# Patient Record
Sex: Female | Born: 1985 | Race: White | Hispanic: No | Marital: Single | Smoking: Current every day smoker
Health system: Southern US, Community
[De-identification: ages and names within clinical notes are randomized; demographics above are authoritative.]

## PROBLEM LIST (undated history)

## (undated) DIAGNOSIS — F17200 Nicotine dependence, unspecified, uncomplicated: Secondary | ICD-10-CM

## (undated) DIAGNOSIS — K589 Irritable bowel syndrome without diarrhea: Secondary | ICD-10-CM

## (undated) HISTORY — DX: Irritable bowel syndrome, unspecified: K58.9

## (undated) HISTORY — DX: Nicotine dependence, unspecified, uncomplicated: F17.200

---
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
## 2003-12-26 HISTORY — PX: CHOLECYSTECTOMY: SHX55

## 2004-12-25 HISTORY — PX: COLONOSCOPY: SHX174

## 2009-09-13 ENCOUNTER — Ambulatory Visit: Payer: Self-pay | Admitting: Internal Medicine

## 2010-12-25 HISTORY — PX: SHOULDER SURGERY: SHX246

## 2011-11-08 ENCOUNTER — Ambulatory Visit: Payer: Self-pay

## 2011-12-23 ENCOUNTER — Inpatient Hospital Stay: Payer: Self-pay | Admitting: Obstetrics and Gynecology

## 2011-12-30 ENCOUNTER — Emergency Department: Payer: Self-pay | Admitting: Emergency Medicine

## 2013-02-06 ENCOUNTER — Ambulatory Visit: Payer: 59 | Admitting: Family Medicine

## 2013-02-13 ENCOUNTER — Encounter: Payer: Self-pay | Admitting: Family Medicine

## 2013-02-13 ENCOUNTER — Ambulatory Visit (INDEPENDENT_AMBULATORY_CARE_PROVIDER_SITE_OTHER): Payer: 59 | Admitting: Family Medicine

## 2013-02-13 VITALS — BP 140/90 | HR 80 | Temp 98.7°F | Ht 63.0 in | Wt 124.2 lb

## 2013-02-13 DIAGNOSIS — F17200 Nicotine dependence, unspecified, uncomplicated: Secondary | ICD-10-CM

## 2013-02-13 DIAGNOSIS — K589 Irritable bowel syndrome without diarrhea: Secondary | ICD-10-CM

## 2013-02-13 DIAGNOSIS — Z0000 Encounter for general adult medical examination without abnormal findings: Secondary | ICD-10-CM

## 2013-02-13 NOTE — Assessment & Plan Note (Signed)
Precontemplative. Knows to return if decides to cut back.

## 2013-02-13 NOTE — Patient Instructions (Addendum)
Good to meet you today, call us with questions. Return as needed or in 2-3 years for next physical Keep thinking about quitting smoking - let me know if you want assistance. remember sunscreen! Blood work today.

## 2013-02-13 NOTE — Progress Notes (Signed)
Subjective:    Patient ID: Jacqueline [name], female    DOB: 24-Oct-1986, 27 y.o.   MRN: [id number]  HPI CC: new pt to establish  Smoker - 1/2 ppd for last 9 years.  Precontemplative.  Habit and enjoys social aspect.  Has tried e cig and lozenges.  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  H/o IBS.  Quiescent.  Knows dietary regimen.  Seat belt use discussed. Sunscreen use discussed.  Preventative: well woman with OBGYN, gets yearly and normal Tdap - 2012 Colonoscopy 2006 - normal.  Done in [place].  Along with upper and lower GI.  Strong fmhx colon cancer.  Told had IBS.  Medications and allergies reviewed and updated in chart.  Past histories reviewed and updated if relevant as below. There is no problem list on file for this patient.  Past Medical History  Diagnosis Date  . Smoker    Past Surgical History  Procedure Laterality Date  . Rotator cuff repair  2004    right  . Shoulder surgery  2012    bone spurs; right  . Cesarean section  2012  . Cholecystectomy  2005   History  Substance Use Topics  . Smoking status: Current Every Day Smoker -- 0.50 packs/day    Types: Cigarettes  . Smokeless tobacco: Never Used  . Alcohol Use: Yes     Comment: very rare (6 pack/year)   Family History  Problem Relation Age of Onset  . Cancer Paternal Uncle 40    colon  . Cancer Paternal Grandfather 29    colon  . CAD Maternal Grandfather 40    MI  . CAD Paternal Grandfather   . Hypertension Mother   . Hyperlipidemia Mother   . Thyroid disease Mother   . Stroke Neg Hx   . Diabetes Neg Hx    Allergies  Allergen Reactions  . Demerol (Meperidine) Nausea And Vomiting   No current outpatient prescriptions on file prior to visit.   No current facility-administered medications on file prior to visit.     Review of Systems  Constitutional: Negative for fever, chills, activity change, appetite change, fatigue and unexpected weight change.  HENT: Negative for hearing loss and neck  pain.   Eyes: Negative for visual disturbance.  Respiratory: Negative for cough, chest tightness, shortness of breath and wheezing.   Cardiovascular: Negative for chest pain, palpitations and leg swelling.  Gastrointestinal: Negative for nausea, vomiting, abdominal pain, diarrhea, constipation, blood in stool and abdominal distention.  Genitourinary: Negative for hematuria and difficulty urinating.  Musculoskeletal: Negative for myalgias and arthralgias.  Skin: Negative for rash.  Neurological: Negative for dizziness, seizures, syncope and headaches.  Hematological: Does not bruise/bleed easily.  Psychiatric/Behavioral: Negative for dysphoric mood. The patient is not nervous/anxious.        Objective:   Physical Exam  Nursing note and vitals reviewed. Constitutional: She is oriented to person, place, and time. She appears well-developed and well-nourished. No distress.  HENT:  Head: Normocephalic and atraumatic.  Right Ear: Hearing, tympanic membrane, external ear and ear canal normal.  Left Ear: Hearing, tympanic membrane, external ear and ear canal normal.  Nose: Nose normal.  Mouth/Throat: Oropharynx is clear and moist. No oropharyngeal exudate.  Eyes: Conjunctivae and EOM are normal. Pupils are equal, round, and reactive to light. No scleral icterus.  Neck: Normal range of motion. Neck supple. No thyromegaly present.  Cardiovascular: Normal rate, regular rhythm, normal heart sounds and intact distal pulses.   No murmur heard. Pulses:  Radial pulses are 2+ on the right side, and 2+ on the left side.  Pulmonary/Chest: Effort normal and breath sounds normal. No respiratory distress. She has no wheezes. She has no rales.  Abdominal: Soft. Bowel sounds are normal. She exhibits no distension and no mass. There is no tenderness. There is no rebound and no guarding.  Musculoskeletal: Normal range of motion. She exhibits no edema.  Lymphadenopathy:    She has no cervical adenopathy.   Neurological: She is alert and oriented to person, place, and time.  CN grossly intact, station and gait intact  Skin: Skin is warm and dry. No rash noted.  Psychiatric: She has a normal mood and affect. Her behavior is normal. Judgment and thought content normal.      Assessment & Plan:

## 2013-02-13 NOTE — Assessment & Plan Note (Signed)
Preventative protocols reviewed and updated unless pt declined. Discussed healthy diet and lifestyle.  Sunscreen use discussed.

## 2013-02-14 LAB — BASIC METABOLIC PANEL
BUN: 12 mg/dL (ref 6–23)
CO2: 24 mEq/L (ref 19–32)
Calcium: 9.1 mg/dL (ref 8.4–10.5)
Chloride: 108 mEq/L (ref 96–112)
Creatinine, Ser: 0.9 mg/dL (ref 0.4–1.2)
Glucose, Bld: 83 mg/dL (ref 70–99)

## 2013-02-14 LAB — LIPID PANEL
Cholesterol: 174 mg/dL (ref 0–200)
HDL: 60.2 mg/dL (ref >39.00)
Triglycerides: 111 mg/dL (ref 0.0–149.0)

## 2013-02-17 ENCOUNTER — Encounter: Payer: Self-pay | Admitting: *Deleted

## 2014-01-29 ENCOUNTER — Encounter: Payer: Self-pay | Admitting: Family Medicine

## 2014-01-29 ENCOUNTER — Telehealth: Payer: Self-pay | Admitting: Radiology

## 2014-01-29 ENCOUNTER — Ambulatory Visit (INDEPENDENT_AMBULATORY_CARE_PROVIDER_SITE_OTHER): Payer: 59 | Admitting: Family Medicine

## 2014-01-29 VITALS — BP 142/90 | HR 88 | Temp 98.1°F | Wt 131.5 lb

## 2014-01-29 DIAGNOSIS — R454 Irritability and anger: Secondary | ICD-10-CM

## 2014-01-29 MED ORDER — LORAZEPAM 0.5 MG PO TABS: 0.5000 mg | ORAL_TABLET | Freq: Two times a day (BID) | ORAL | Status: DC | PRN

## 2014-01-29 MED ORDER — CITALOPRAM HYDROBROMIDE 10 MG PO TABS: 10.0000 mg | ORAL_TABLET | Freq: Every day | ORAL | Status: DC

## 2014-01-29 NOTE — Progress Notes (Signed)
   Subjective:    Patient ID: Jacqueline PineBeth Siever, female    DOB: 08/08/1986, 28 y.o.   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  Stressed with new responsibilities at work.  Anger outbursts at home and at work - more irritable.  Easily lashes out with minor things.  [place] working out of [place], now moving to [place].  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  Endorses some depression.  Moody.  No anhedonia - enjoys reading and riding side by sides.  Trouble sleeping.  Energy level decreased.  Ability to concentrate decreased.  Appetite down - lost 8 lbs in the last week.  No healthy stress relieving strategies - smokes.  Does not exercise.  Enjoys reading but doesn't dedicate time. Some anxiety as well - but without anxiety/panic attacks.  Stays wound up and worrying about work.  No SI/HI.  Chest pain - describes pressure sensation prior to anger outburst.  Also with headaches - daily.  Denies abd pain, dyspnea.  Smoking - increased with increase in stress.  Up to 3/4 ppd.   HTN - borderline in the past.  Checks BP at inlaw's house - tends to run high at times.  On Beyaz for last 4 yrs.  Has 30 yo at home. BP Readings from Last 3 Encounters:  01/29/14 142/90  02/13/13 140/90     Past Medical History  Diagnosis Date  . Smoker   . IBS (irritable bowel syndrome)     h/o this    Past Surgical History  Procedure Laterality Date  . Rotator cuff repair  2004    right  . Shoulder surgery  2012    bone spurs; right  . Cesarean section  2012  . Cholecystectomy  2005  . Colonoscopy  2006    WNL per pt    Family History  Problem Relation Age of Onset  . Cancer Paternal Uncle 8055    colon  . Cancer Paternal Grandfather 2975    colon  . CAD Maternal Grandfather 40    MI  . CAD Paternal Grandfather   . Hypertension Mother   . Hyperlipidemia Mother   . Thyroid disease Mother   . Stroke Neg  Hx   . Diabetes Neg Hx     Review of Systems Per HPI    Objective:   Physical Exam  Nursing note and vitals reviewed. Constitutional: She appears well-developed and well-nourished. No distress.  HENT:  Mouth/Throat: Oropharynx is clear and moist. No oropharyngeal exudate.  Cardiovascular: Normal rate, regular rhythm, normal heart sounds and intact distal pulses.   No murmur heard. Pulmonary/Chest: Effort normal and breath sounds normal. No respiratory distress. She has no wheezes. She has no rales.  Psychiatric: She has a normal mood and affect. Her behavior is normal. Judgment and thought content normal.       Assessment & Plan:

## 2014-01-29 NOTE — Telephone Encounter (Signed)
LMOM for pt to come back for lab work,she left w/o having her blood work done.

## 2014-01-29 NOTE — Progress Notes (Signed)
Pre-visit discussion using our clinic review tool. No additional management support is needed unless otherwise documented below in the visit note.  

## 2014-01-29 NOTE — Patient Instructions (Addendum)
questionairre today. Start celexa 10mg  daily and lorazepam 0.5mg  twice daily as needed for anxiety or stress (if feeling overwhelmed).  Try to use second med sparingly. Work on healthy stress relieving strategies. Return in 2-3 for follow up, sooner if needed. Think about counseling - if interested let me know.

## 2014-01-29 NOTE — Assessment & Plan Note (Addendum)
Discussed stress leading to anger as manifestation of anxiety/depression. Anticipate adjustment disorder 2/2 new work Counselling psychologistresponsibilities. Possibility of this improving in next few months with things normalizing at work. Pt interested in medication to help with this - start celexa 10mg  daily (discussed slow taper if decides to discontinue, discussed takes 3-4 weeks to take effect, discussed possible worsening of sxs - if this happens pt to call us immediately) Use lorazepam 1/2 to 1 tablet of 0.5mg  bid prn anxiety/stress.  Discussed temporary nature of this medication. Pt will consider counseling and let me know if desires referral. A total of 25 minutes were spent face-to-face with the patient during this encounter and over half of that time was spent on counseling and coordination of care PHQ9 = 15/27 GAD7 = 17/21

## 2014-01-30 ENCOUNTER — Telehealth: Payer: Self-pay | Admitting: Family Medicine

## 2014-01-30 NOTE — Telephone Encounter (Signed)
Relevant patient education mailed to patient.  

## 2014-02-02 NOTE — Telephone Encounter (Signed)
Spoke to pts husband, he will give her the message

## 2014-02-03 ENCOUNTER — Other Ambulatory Visit (INDEPENDENT_AMBULATORY_CARE_PROVIDER_SITE_OTHER): Payer: 59

## 2014-02-03 DIAGNOSIS — R454 Irritability and anger: Secondary | ICD-10-CM

## 2014-02-03 NOTE — Addendum Note (Signed)
Addended by: Alvina ChouWALSH, TERRI J on: 02/03/2014 10:46 AM   Modules accepted: Orders

## 2014-02-04 LAB — TSH: TSH: 0.52 u[IU]/mL (ref 0.35–5.50)

## 2014-02-05 ENCOUNTER — Encounter: Payer: Self-pay | Admitting: *Deleted

## 2014-06-30 ENCOUNTER — Encounter: Payer: Self-pay | Admitting: Family Medicine

## 2014-06-30 ENCOUNTER — Ambulatory Visit (INDEPENDENT_AMBULATORY_CARE_PROVIDER_SITE_OTHER): Payer: 59 | Admitting: Family Medicine

## 2014-06-30 ENCOUNTER — Ambulatory Visit: Payer: 59 | Admitting: Family Medicine

## 2014-06-30 ENCOUNTER — Telehealth: Payer: Self-pay | Admitting: Family Medicine

## 2014-06-30 VITALS — BP 132/86 | HR 74 | Temp 98.6°F | Wt 127.0 lb

## 2014-06-30 DIAGNOSIS — R454 Irritability and anger: Secondary | ICD-10-CM

## 2014-06-30 MED ORDER — SERTRALINE HCL 25 MG PO TABS: 25.0000 mg | ORAL_TABLET | Freq: Every day | ORAL | Status: DC

## 2014-06-30 NOTE — Progress Notes (Signed)
   BP 132/86  Pulse 74  Temp(Src) 98.6 F (37 C) (Oral)  Wt 127 lb (57.607 kg)  SpO2 98%  LMP 06/18/2014   CC: f/u anxiety  Subjective:    Patient ID: Jacqueline [name], female    DOB: 1986/02/20, 28 y.o.   MRN: [id number]  HPI: Jacqueline [name] is a 28 y.o. female presenting on 06/30/2014 for Follow-up   See prior note from 01/2014 for details. At that time, thought adjustment disorder after recent job promotion with increased stress. Started on celexa and lorazepam. Celexa caused increased aggressiveness per husband so she stopped this. Never tried lorazepam. May lose work at end of year. Staying very stressed at work. Staying stressed at home.   Staying angry, but feels able to manage stress better than prior.  To change birth control as well - possible hormonal component to irritability.  Changing from [place] to Nexplanon.  Relevant past medical, surgical, family and social history reviewed and updated as indicated.  Allergies and medications reviewed and updated. No current outpatient prescriptions on file prior to visit.   No current facility-administered medications on file prior to visit.    Review of Systems Per HPI unless specifically indicated above    Objective:    BP 132/86  Pulse 74  Temp(Src) 98.6 F (37 C) (Oral)  Wt 127 lb (57.607 kg)  SpO2 98%  LMP 06/18/2014  Physical Exam  Nursing note and vitals reviewed. Constitutional: She appears well-developed and well-nourished. No distress.  Psychiatric: She has a normal mood and affect.       Assessment & Plan:   Problem List Items Addressed This Visit   Irritability and anger - Primary     Presumed adjustment disorder - did not tolerate celexa (worsening irritability). Will trial zoloft 25mg  daily, asked her to call me in 1 mo with update. rtc PRN if needed. Again discussed possible worsening prior to improvement - if this happens, stop med immediately and notify me PHQ9 = 15 --> 2 GAD7 = 17 --> 12.        Follow up plan: Return as needed, for follow up visit.

## 2014-06-30 NOTE — Patient Instructions (Addendum)
Let's try zoloft (sertraline) 25mg  daily. If helpful we may increase dose. Call me with an update in 1 month with how you're doing. Good to see you today, call us with questions.

## 2014-06-30 NOTE — Telephone Encounter (Signed)
Relevant patient education assigned to patient using Emmi. ° °

## 2014-06-30 NOTE — Assessment & Plan Note (Signed)
Presumed adjustment disorder - did not tolerate celexa (worsening irritability). Will trial zoloft 25mg  daily, asked her to call me in 1 mo with update. rtc PRN if needed. Again discussed possible worsening prior to improvement - if this happens, stop med immediately and notify me PHQ9 = 15 --> 2 GAD7 = 17 --> 12.

## 2014-06-30 NOTE — Progress Notes (Signed)
Pre visit review using our clinic review tool, if applicable. No additional management support is needed unless otherwise documented below in the visit note. 

## 2014-09-04 ENCOUNTER — Other Ambulatory Visit: Payer: Self-pay | Admitting: *Deleted

## 2014-09-04 MED ORDER — SERTRALINE HCL 25 MG PO TABS: 25.0000 mg | ORAL_TABLET | Freq: Every day | ORAL | Status: DC

## 2015-01-04 ENCOUNTER — Other Ambulatory Visit: Payer: Self-pay | Admitting: Family Medicine

## 2015-01-04 NOTE — Telephone Encounter (Signed)
p left v/m requesting refill status of sertraline. Left v/m for pt sertraline was refilled earlier today to [facility].

## 2015-02-05 ENCOUNTER — Ambulatory Visit: Payer: Self-pay | Admitting: Family Medicine

## 2015-04-18 NOTE — Discharge Summary (Signed)
PATIENT NAMLum Keas:  [name], Jacqueline [name] MR#:  [id number] DATE OF BIRTH:  [birth year]  DATE OF ADMISSION:  12/23/2011 DATE OF DISCHARGE:  12/26/2011  ADMISSION DIAGNOSES:  1. Term.  2. Labor.  3. Cephalopelvic tubes disproportion with fetal distress.   DISCHARGE DIAGNOSES:  1. Term.  2. Labor.  3. Cephalopelvic disproportion with fetal distress.   PROCEDURE: Cesarean section.  HOSPITAL COURSE: The patient did well, tolerating diet on day one. Postoperative hematocrit  was 32.0. On day three she was deemed ready for discharge and was discharged home with routine prescriptions, precautions, and follow-up.  ____________________________ Reatha Harpsicky L. Logan BoresEvans, MD rle:slb D: 01/01/2012 22:02:35 ET T: 01/02/2012 17:01:29 ET JOB#: [id number]  cc: Ricky L. Logan BoresEvans, MD, <Dictator> Augustina MoodICK L Lekisha Mcghee MD ELECTRONICALLY SIGNED 01/03/2012 9:24

## 2015-08-02 ENCOUNTER — Other Ambulatory Visit: Payer: Self-pay | Admitting: Family Medicine

## 2015-10-27 ENCOUNTER — Encounter: Payer: Self-pay | Admitting: Family Medicine

## 2015-10-27 ENCOUNTER — Ambulatory Visit (INDEPENDENT_AMBULATORY_CARE_PROVIDER_SITE_OTHER): Payer: 59 | Admitting: Family Medicine

## 2015-10-27 VITALS — BP 142/95 | HR 96 | Temp 98.8°F | Ht 63.5 in | Wt 131.5 lb

## 2015-10-27 DIAGNOSIS — J189 Pneumonia, unspecified organism: Secondary | ICD-10-CM

## 2015-10-27 DIAGNOSIS — R509 Fever, unspecified: Secondary | ICD-10-CM

## 2015-10-27 DIAGNOSIS — Z720 Tobacco use: Secondary | ICD-10-CM | POA: Diagnosis not present

## 2015-10-27 DIAGNOSIS — F17200 Nicotine dependence, unspecified, uncomplicated: Secondary | ICD-10-CM

## 2015-10-27 LAB — POCT INFLUENZA A/B
INFLUENZA A, POC: NEGATIVE
INFLUENZA B, POC: NEGATIVE

## 2015-10-27 MED ORDER — LEVOFLOXACIN 500 MG PO TABS: 500.0000 mg | ORAL_TABLET | Freq: Every day | ORAL | Status: DC

## 2015-10-27 MED ORDER — HYDROCODONE-HOMATROPINE 5-1.5 MG/5ML PO SYRP: ORAL_SOLUTION | ORAL | Status: DC

## 2015-10-27 MED ORDER — PREDNISONE 20 MG PO TABS: ORAL_TABLET | ORAL | Status: DC

## 2015-10-27 NOTE — Progress Notes (Signed)
Dr. Karleen Hampshire T. Marialena Wollen, MD, CAQ Sports Medicine [facility] [address] [place], [address] Phone: [phone] Fax: [phone]  10/27/2015  Patient: Jacqueline [name], MRN: [id number], DOB: October 05, 1986, 29 y.o.  Primary Physician:  Eustaquio Boyden, MD   Chief Complaint  Patient presents with  . Cough    x 6 weeks  . Sore Throat  . Nasal Congestion   Subjective:   Jacqueline [name] is a 29 y.o. very pleasant female patient who presents with the following:  Coughing for 6 weeks, not better after z-pak and steroids.  Down to 5 cigs, 10 pack years.  + fever. Whole body is hurting, worse in the last 48 hours.  Some chest pain.   Not sleeping at all. Coughing all the time. Myalgia, arthralgia in the last few days  Past Medical History, Surgical History, Social History, Family History, Problem List, Medications, and Allergies have been reviewed and updated if relevant.  Patient Active Problem List   Diagnosis Date Noted  . Irritability and anger 01/29/2014  . Healthcare maintenance 02/13/2013  . Smoker   . IBS (irritable bowel syndrome)     Past Medical History  Diagnosis Date  . Smoker   . IBS (irritable bowel syndrome)     h/o this    Past Surgical History  Procedure Laterality Date  . Rotator cuff repair  2004    right  . Shoulder surgery  2012    bone spurs; right  . Cesarean section  2012  . Cholecystectomy  2005  . Colonoscopy  2006    WNL per pt    Social History   Social History  . Marital Status: Single    Spouse Name: N/A  . Number of Children: N/A  . Years of Education: N/A   Occupational History  . Not on file.   Social History Main Topics  . Smoking status: Current Every Day Smoker -- 0.50 packs/day    Types: Cigarettes  . Smokeless tobacco: Never Used  . Alcohol Use: Yes     Comment: very rare (6 pack/year)  . Drug Use: No  . Sexual Activity: Not on file   Other Topics Concern  . Not on file   Social History  Narrative   Caffeine: 1-2 soft drinks/day   Lives with husband and 1 child, 2 dogs.   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 Cancer Paternal Uncle 26    colon  . Cancer Paternal Grandfather 4    colon  . CAD Maternal Grandfather 40    MI  . CAD Paternal Grandfather   . Hypertension Mother   . Hyperlipidemia Mother   . Thyroid disease Mother   . Stroke Neg Hx   . Diabetes Neg Hx     Allergies  Allergen Reactions  . Demerol [Meperidine] Nausea And Vomiting    Medication list reviewed and updated in full in [facility].  ROS: GEN: Acute illness details above GI: Tolerating PO intake GU: maintaining adequate hydration and urination Pulm: occ SOB Interactive and getting along well at home.  Otherwise, ROS is as per the HPI.   Objective:   BP 142/95 mmHg  Pulse 96  Temp(Src) 98.8 F (37.1 C) (Oral)  Ht 5' 3.5" (1.613 m)  Wt 131 lb  8 oz (59.648 kg)  BMI 22.93 kg/m2  SpO2 100%  LMP 10/06/2015 (Approximate)   GEN: A and O x 3. WDWN. NAD.    ENT: Nose clear, ext NML.  No LAD.  No JVD.  TM's clear. Oropharynx clear.  PULM: Normal WOB, no distress. No crackles, rare wheezes, no rhonchi. CV: RRR, no M/G/R, No rubs, No JVD.   EXT: warm and well-perfused, No c/c/e. PSYCH: Pleasant and conversant.    Laboratory and Imaging Data: Results for orders placed or performed in visit on 10/27/15  POCT Influenza A/B  Result Value Ref Range   Influenza A, POC Negative Negative   Influenza B, POC Negative Negative     Assessment and Plan:   Walking pneumonia  Fever, unspecified - Plan: POCT Influenza A/B  Smoker  Flu vs flu-like illness acutely with 6 weeks of prod cough, will cover with ABX Smoker, sob, rare wheezing, add prednisone  Follow-up: No Follow-up on file.  New Prescriptions    HYDROCODONE-HOMATROPINE (HYCODAN) 5-1.5 MG/5ML SYRUP    1 tsp po at night before bed prn cough   LEVOFLOXACIN (LEVAQUIN) 500 MG TABLET    Take 1 tablet (500 mg total) by mouth daily.   PREDNISONE (DELTASONE) 20 MG TABLET    2 tabs po for 5 days   Modified Medications   No medications on file   Orders Placed This Encounter  Procedures  . POCT Influenza A/B    Signed,  Brynlynn Walko T. Tamaria Dunleavy, MD   Patient's Medications  New Prescriptions   HYDROCODONE-HOMATROPINE (HYCODAN) 5-1.5 MG/5ML SYRUP    1 tsp po at night before bed prn cough   LEVOFLOXACIN (LEVAQUIN) 500 MG TABLET    Take 1 tablet (500 mg total) by mouth daily.   PREDNISONE (DELTASONE) 20 MG TABLET    2 tabs po for 5 days  Previous Medications   NUVARING 0.12-0.015 MG/24HR VAGINAL RING    PLACE 1 RING VAGINALLY MONTHLY. LEAVE IN PLACE FOR 3 CONSECUTIVE WEEKS, THEN REMOVE FOR 1 WEEK.  Modified Medications   No medications on file  Discontinued Medications   SERTRALINE (ZOLOFT) 25 MG TABLET    Take one tablet daily **MUST HAVE FOLLOW UP FOR FURTHER REFILLS**

## 2015-10-27 NOTE — Progress Notes (Signed)
Pre visit review using our clinic review tool, if applicable. No additional management support is needed unless otherwise documented below in the visit note. 

## 2016-09-19 ENCOUNTER — Other Ambulatory Visit: Payer: 59

## 2016-09-20 ENCOUNTER — Other Ambulatory Visit: Payer: 59

## 2016-09-21 ENCOUNTER — Encounter: Payer: Self-pay | Admitting: Family Medicine

## 2016-09-21 ENCOUNTER — Ambulatory Visit (INDEPENDENT_AMBULATORY_CARE_PROVIDER_SITE_OTHER): Payer: 59 | Admitting: Family Medicine

## 2016-09-21 VITALS — BP 132/78 | HR 87 | Temp 98.5°F | Ht 63.0 in | Wt 128.5 lb

## 2016-09-21 DIAGNOSIS — F17200 Nicotine dependence, unspecified, uncomplicated: Secondary | ICD-10-CM

## 2016-09-21 DIAGNOSIS — Z720 Tobacco use: Secondary | ICD-10-CM | POA: Diagnosis not present

## 2016-09-21 DIAGNOSIS — Z0000 Encounter for general adult medical examination without abnormal findings: Secondary | ICD-10-CM | POA: Diagnosis not present

## 2016-09-21 DIAGNOSIS — Z8349 Family history of other endocrine, nutritional and metabolic diseases: Secondary | ICD-10-CM | POA: Diagnosis not present

## 2016-09-21 NOTE — Progress Notes (Signed)
BP 132/78 (BP Location: Left Arm, Patient Position: Sitting, Cuff Size: Normal)   Pulse 87   Temp 98.5 F (36.9 C) (Oral)   Ht 5\' 3"  (1.6 m)   Wt 128 lb 8 oz (58.3 kg)   LMP 08/23/2016   SpO2 99%   BMI 22.76 kg/m    CC: CPE Subjective:    Patient ID: [name], female    DOB: 1986/01/11, 30 y.o.   MRN: [id number]  HPI: Tikisha Molinaro is a 30 y.o. female presenting on 09/21/2016 for Annual Exam   Upcoming 5wk work trip to [employer] [place].  Preventative: Colonoscopy 2006 - normal. Done in [place] along with upper GI. Told had IBS. Two 2nd degree relatives with colon cancer.  Well woman with OBGYN, gets yearly. H/o abnormal this year s/p normal colposcopy.  Flu shot - declines Tdap - 2012 Seat belt use discussed. Sunscreen use discussed. No changing moles on skin.  LMP 07/2016. Nuvaring Smoking - 5-6 cig/day.  Alcohol - rare   Caffeine: 1-2 soft drinks/day Lives with husband and 1 child, 2 dogs.  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  Activity: physical job.  Diet: some water, fruits/vegetables daily.   Relevant past medical, surgical, family and social history reviewed and updated as indicated. Interim medical history since our last visit reviewed. Allergies and medications reviewed and updated. Current Outpatient Prescriptions on File Prior to Visit  Medication Sig  . NUVARING 0.12-0.015 MG/24HR vaginal ring PLACE 1 RING VAGINALLY MONTHLY. LEAVE IN PLACE FOR 3 CONSECUTIVE WEEKS, THEN REMOVE FOR 1 WEEK.   No current facility-administered medications on file prior to visit.     Review of Systems  Constitutional: Negative for activity change, appetite change, chills, fatigue, fever and unexpected weight change.  HENT: Negative for hearing loss.   Eyes: Negative for visual disturbance.  Respiratory: Negative for cough, chest tightness, shortness of breath and wheezing.   Cardiovascular: Negative for chest pain,  palpitations and leg swelling.  Gastrointestinal: Negative for abdominal distention, abdominal pain, blood in stool, constipation, diarrhea, nausea and vomiting.  Genitourinary: Negative for difficulty urinating and hematuria.  Musculoskeletal: Negative for arthralgias, myalgias and neck pain.  Skin: Negative for rash.  Neurological: Negative for dizziness, seizures, syncope and headaches.  Hematological: Negative for adenopathy. Does not bruise/bleed easily.  Psychiatric/Behavioral: Negative for dysphoric mood. The patient is not nervous/anxious.    Per HPI unless specifically indicated in ROS section     Objective:    BP 132/78 (BP Location: Left Arm, Patient Position: Sitting, Cuff Size: Normal)   Pulse 87   Temp 98.5 F (36.9 C) (Oral)   Ht 5\' 3"  (1.6 m)   Wt 128 lb 8 oz (58.3 kg)   LMP 08/23/2016   SpO2 99%   BMI 22.76 kg/m   Wt Readings from Last 3 Encounters:  09/21/16 128 lb 8 oz (58.3 kg)  10/27/15 131 lb 8 oz (59.6 kg)  06/30/14 127 lb (57.6 kg)    Physical Exam  Constitutional: She is oriented to person, place, and time. She appears well-developed and well-nourished. No distress.  HENT:  Head: Normocephalic and atraumatic.  Right Ear: Hearing, tympanic membrane, external ear and ear canal normal.  Left Ear: Hearing, tympanic membrane, external ear and ear canal normal.  Nose: Nose normal.  Mouth/Throat: Uvula is midline, oropharynx is clear and moist and mucous membranes are normal. No oropharyngeal exudate, posterior oropharyngeal edema or posterior oropharyngeal  erythema.  Eyes: Conjunctivae and EOM are normal. Pupils are equal, round, and reactive to light. No scleral icterus.  Neck: Normal range of motion. Neck supple. No thyromegaly present.  Cardiovascular: Normal rate, regular rhythm, normal heart sounds and intact distal pulses.   No murmur heard. Pulses:      Radial pulses are 2+ on the right side, and 2+ on the left side.  Pulmonary/Chest: Effort normal  and breath sounds normal. No respiratory distress. She has no wheezes. She has no rales.  Abdominal: Soft. Bowel sounds are normal. She exhibits no distension and no mass. There is no tenderness. There is no rebound and no guarding.  Musculoskeletal: Normal range of motion. She exhibits no edema.  Lymphadenopathy:    She has no cervical adenopathy.  Neurological: She is alert and oriented to person, place, and time.  CN grossly intact, station and gait intact  Skin: Skin is warm and dry. No rash noted.  Psychiatric: She has a normal mood and affect. Her behavior is normal. Judgment and thought content normal.  Nursing note and vitals reviewed.     Assessment & Plan:   Problem List Items Addressed This Visit    Healthcare maintenance - Primary    Preventative protocols reviewed and updated unless pt declined. Discussed healthy diet and lifestyle.       Relevant Orders   Lipid panel   Basic metabolic panel   Smoker    Contemplative. Discussed NRT specifically nicorette and "park and chew method". 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        Other Visit Diagnoses    Family history of thyroid disease       Relevant Orders   TSH       Follow up plan: Return in about 1 year (around 09/21/2017), or as needed, for annual exam, prior fasting for blood work.  Eustaquio BoydenJavier Jeremiah Curci, MD

## 2016-09-21 NOTE — Patient Instructions (Signed)
You are doing well today. Return as needed or in 1 year for next physical.  Health Maintenance, Female Adopting a healthy lifestyle and getting preventive care can go a long way to promote health and wellness. Talk with your health care provider about what schedule of regular examinations is right for you. This is a good chance for you to check in with your provider about disease prevention and staying healthy. In between checkups, there are plenty of things you can do on your own. Experts have done a lot of research about which lifestyle changes and preventive measures are most likely to keep you healthy. Ask your health care provider for more information. WEIGHT AND DIET  Eat a healthy diet  Be sure to include plenty of vegetables, fruits, low-fat dairy products, and lean protein.  Do not eat a lot of foods high in solid fats, added sugars, or salt.  Get regular exercise. This is one of the most important things you can do for your health.  Most adults should exercise for at least 150 minutes each week. The exercise should increase your heart rate and make you sweat (moderate-intensity exercise).  Most adults should also do strengthening exercises at least twice a week. This is in addition to the moderate-intensity exercise.  Maintain a healthy weight  Body mass index (BMI) is a measurement that can be used to identify possible weight problems. It estimates body fat based on height and weight. Your health care provider can help determine your BMI and help you achieve or maintain a healthy weight.  For females 11 years of age and older:   A BMI below 18.5 is considered underweight.  A BMI of 18.5 to 24.9 is normal.  A BMI of 25 to 29.9 is considered overweight.  A BMI of 30 and above is considered obese.  Watch levels of cholesterol and blood lipids  You should start having your blood tested for lipids and cholesterol at 32 years of age, then have this test every 5 years.  You  may need to have your cholesterol levels checked more often if:  Your lipid or cholesterol levels are high.  You are older than 32 years of age.  You are at high risk for heart disease.  CANCER SCREENING   Lung Cancer  Lung cancer screening is recommended for adults 69-68 years old who are at high risk for lung cancer because of a history of smoking.  A yearly low-dose CT scan of the lungs is recommended for people who:  Currently smoke.  Have quit within the past 15 years.  Have at least a 30-pack-year history of smoking. A pack year is smoking an average of one pack of cigarettes a day for 1 year.  Yearly screening should continue until it has been 15 years since you quit.  Yearly screening should stop if you develop a health problem that would prevent you from having lung cancer treatment.  Breast Cancer  Practice breast self-awareness. This means understanding how your breasts normally appear and feel.  It also means doing regular breast self-exams. Let your health care provider know about any changes, no matter how small.  If you are in your 20s or 30s, you should have a clinical breast exam (CBE) by a health care provider every 1-3 years as part of a regular health exam.  If you are 20 or older, have a CBE every year. Also consider having a breast X-ray (mammogram) every year.  If you have a family  history of breast cancer, talk to your health care provider about genetic screening.  If you are at high risk for breast cancer, talk to your health care provider about having an MRI and a mammogram every year.  Breast cancer gene (BRCA) assessment is recommended for women who have family members with BRCA-related cancers. BRCA-related cancers include:  Breast.  Ovarian.  Tubal.  Peritoneal cancers.  Results of the assessment will determine the need for genetic counseling and BRCA1 and BRCA2 testing. Cervical Cancer Your health care provider may recommend that you  be screened regularly for cancer of the pelvic organs (ovaries, uterus, and vagina). This screening involves a pelvic examination, including checking for microscopic changes to the surface of your cervix (Pap test). You may be encouraged to have this screening done every 3 years, beginning at age 61.  For women ages 47-65, health care providers may recommend pelvic exams and Pap testing every 3 years, or they may recommend the Pap and pelvic exam, combined with testing for human papilloma virus (HPV), every 5 years. Some types of HPV increase your risk of cervical cancer. Testing for HPV may also be done on women of any age with unclear Pap test results.  Other health care providers may not recommend any screening for nonpregnant women who are considered low risk for pelvic cancer and who do not have symptoms. Ask your health care provider if a screening pelvic exam is right for you.  If you have had past treatment for cervical cancer or a condition that could lead to cancer, you need Pap tests and screening for cancer for at least 20 years after your treatment. If Pap tests have been discontinued, your risk factors (such as having a new sexual partner) need to be reassessed to determine if screening should resume. Some women have medical problems that increase the chance of getting cervical cancer. In these cases, your health care provider may recommend more frequent screening and Pap tests. Colorectal Cancer  This type of cancer can be detected and often prevented.  Routine colorectal cancer screening usually begins at 32 years of age and continues through 32 years of age.  Your health care provider may recommend screening at an earlier age if you have risk factors for colon cancer.  Your health care provider may also recommend using home test kits to check for hidden blood in the stool.  A small camera at the end of a tube can be used to examine your colon directly (sigmoidoscopy or colonoscopy).  This is done to check for the earliest forms of colorectal cancer.  Routine screening usually begins at age 30.  Direct examination of the colon should be repeated every 5-10 years through 32 years of age. However, you may need to be screened more often if early forms of precancerous polyps or small growths are found. Skin Cancer  Check your skin from head to toe regularly.  Tell your health care provider about any new moles or changes in moles, especially if there is a change in a mole's shape or color.  Also tell your health care provider if you have a mole that is larger than the size of a pencil eraser.  Always use sunscreen. Apply sunscreen liberally and repeatedly throughout the day.  Protect yourself by wearing long sleeves, pants, a wide-brimmed hat, and sunglasses whenever you are outside. HEART DISEASE, DIABETES, AND HIGH BLOOD PRESSURE   High blood pressure causes heart disease and increases the risk of stroke. High blood pressure  is more likely to develop in:  People who have blood pressure in the high end of the normal range (130-139/85-89 mm Hg).  People who are overweight or obese.  People who are African American.  If you are 105-22 years of age, have your blood pressure checked every 3-5 years. If you are 70 years of age or older, have your blood pressure checked every year. You should have your blood pressure measured twice--once when you are at a hospital or clinic, and once when you are not at a hospital or clinic. Record the average of the two measurements. To check your blood pressure when you are not at a hospital or clinic, you can use:  An automated blood pressure machine at a pharmacy.  A home blood pressure monitor.  If you are between 41 years and 15 years old, ask your health care provider if you should take aspirin to prevent strokes.  Have regular diabetes screenings. This involves taking a blood sample to check your fasting blood sugar level.  If you  are at a normal weight and have a low risk for diabetes, have this test once every three years after 32 years of age.  If you are overweight and have a high risk for diabetes, consider being tested at a younger age or more often. PREVENTING INFECTION  Hepatitis B  If you have a higher risk for hepatitis B, you should be screened for this virus. You are considered at high risk for hepatitis B if:  You were born in a country where hepatitis B is common. Ask your health care provider which countries are considered high risk.  Your parents were born in a high-risk country, and you have not been immunized against hepatitis B (hepatitis B vaccine).  You have HIV or AIDS.  You use needles to inject street drugs.  You live with someone who has hepatitis B.  You have had sex with someone who has hepatitis B.  You get hemodialysis treatment.  You take certain medicines for conditions, including cancer, organ transplantation, and autoimmune conditions. Hepatitis C  Blood testing is recommended for:  Everyone born from [birth year] through 1967.  Anyone with known risk factors for hepatitis C. Sexually transmitted infections (STIs)  You should be screened for sexually transmitted infections (STIs) including gonorrhea and chlamydia if:  You are sexually active and are younger than 32 years of age.  You are older than 32 years of age and your health care provider tells you that you are at risk for this type of infection.  Your sexual activity has changed since you were last screened and you are at an increased risk for chlamydia or gonorrhea. Ask your health care provider if you are at risk.  If you do not have HIV, but are at risk, it may be recommended that you take a prescription medicine daily to prevent HIV infection. This is called pre-exposure prophylaxis (PrEP). You are considered at risk if:  You are sexually active and do not regularly use condoms or know the HIV status of your  partner(s).  You take drugs by injection.  You are sexually active with a partner who has HIV. Talk with your health care provider about whether you are at high risk of being infected with HIV. If you choose to begin PrEP, you should first be tested for HIV. You should then be tested every 3 months for as long as you are taking PrEP.  PREGNANCY   If you are premenopausal and  you may become pregnant, ask your health care provider about preconception counseling.  If you may become pregnant, take 400 to 800 micrograms (mcg) of folic acid every day.  If you want to prevent pregnancy, talk to your health care provider about birth control (contraception). OSTEOPOROSIS AND MENOPAUSE   Osteoporosis is a disease in which the bones lose minerals and strength with aging. This can result in serious bone fractures. Your risk for osteoporosis can be identified using a bone density scan.  If you are 53 years of age or older, or if you are at risk for osteoporosis and fractures, ask your health care provider if you should be screened.  Ask your health care provider whether you should take a calcium or vitamin D supplement to lower your risk for osteoporosis.  Menopause may have certain physical symptoms and risks.  Hormone replacement therapy may reduce some of these symptoms and risks. Talk to your health care provider about whether hormone replacement therapy is right for you.  HOME CARE INSTRUCTIONS   Schedule regular health, dental, and eye exams.  Stay current with your immunizations.   Do not use any tobacco products including cigarettes, chewing tobacco, or electronic cigarettes.  If you are pregnant, do not drink alcohol.  If you are breastfeeding, limit how much and how often you drink alcohol.  Limit alcohol intake to no more than 1 drink per day for nonpregnant women. One drink equals 12 ounces of beer, 5 ounces of wine, or 1 ounces of hard liquor.  Do not use street drugs.  Do  not share needles.  Ask your health care provider for help if you need support or information about quitting drugs.  Tell your health care provider if you often feel depressed.  Tell your health care provider if you have ever been abused or do not feel safe at home.   This information is not intended to replace advice given to you by your health care provider. Make sure you discuss any questions you have with your health care provider.   Document Released: 06/26/2011 Document Revised: 01/01/2015 Document Reviewed: 11/12/2013 Elsevier Interactive Patient Education [employer].

## 2016-09-21 NOTE — Assessment & Plan Note (Addendum)
Contemplative. Discussed NRT specifically nicorette and "park and che"w method. 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

## 2016-09-21 NOTE — Assessment & Plan Note (Addendum)
Preventative protocols reviewed and updated unless pt declined. Discussed healthy diet and lifestyle.  

## 2016-09-21 NOTE — Progress Notes (Signed)
Pre visit review using our clinic review tool, if applicable. No additional management support is needed unless otherwise documented below in the visit note. 

## 2016-09-22 LAB — BASIC METABOLIC PANEL
BUN: 7 mg/dL (ref 6–23)
CALCIUM: 8.7 mg/dL (ref 8.4–10.5)
CO2: 25 mEq/L (ref 19–32)
Chloride: 105 mEq/L (ref 96–112)
Creatinine, Ser: 0.82 mg/dL (ref 0.40–1.20)
GFR: 85.54 mL/min (ref >60.00)
Glucose, Bld: 74 mg/dL (ref 70–99)
POTASSIUM: 3.9 meq/L (ref 3.5–5.1)
SODIUM: 137 meq/L (ref 135–145)

## 2016-09-22 LAB — LIPID PANEL
CHOL/HDL RATIO: 3
Cholesterol: 175 mg/dL (ref 0–200)
HDL: 63.2 mg/dL (ref >39.00)
LDL CALC: 90 mg/dL (ref 0–99)
NONHDL: 111.49
TRIGLYCERIDES: 107 mg/dL (ref 0.0–149.0)
VLDL: 21.4 mg/dL (ref 0.0–40.0)

## 2016-09-22 LAB — TSH: TSH: 1.56 u[IU]/mL (ref 0.35–4.50)

## 2016-11-20 ENCOUNTER — Encounter: Payer: Self-pay | Admitting: Family

## 2016-11-20 ENCOUNTER — Ambulatory Visit (INDEPENDENT_AMBULATORY_CARE_PROVIDER_SITE_OTHER): Payer: 59 | Admitting: Family

## 2016-11-20 VITALS — BP 140/91 | HR 94 | Temp 99.6°F | Resp 16 | Ht 63.0 in | Wt 132.2 lb

## 2016-11-20 DIAGNOSIS — R52 Pain, unspecified: Secondary | ICD-10-CM

## 2016-11-20 DIAGNOSIS — R030 Elevated blood-pressure reading, without diagnosis of hypertension: Secondary | ICD-10-CM

## 2016-11-20 DIAGNOSIS — J029 Acute pharyngitis, unspecified: Secondary | ICD-10-CM

## 2016-11-20 DIAGNOSIS — B349 Viral infection, unspecified: Secondary | ICD-10-CM | POA: Diagnosis not present

## 2016-11-20 LAB — POC INFLUENZA A&B (BINAX/QUICKVUE)
INFLUENZA A, POC: NEGATIVE
INFLUENZA B, POC: NEGATIVE

## 2016-11-20 LAB — POCT RAPID STREP A (OFFICE): Rapid Strep A Screen: NEGATIVE

## 2016-11-20 NOTE — Progress Notes (Signed)
Pre visit review using our clinic review tool, if applicable. No additional management support is needed unless otherwise documented below in the visit note. 

## 2016-11-20 NOTE — Progress Notes (Signed)
Subjective:    Patient ID: Ernest PineBeth Zern, female    DOB: 06/20/1986, 30 y.o.   MRN: [id number]  HPI  [name] is a 30 yr old female who presents today with chief complaint of body aches.  "I just hurt all over."  Had 1 day of a bad sore throat. This AM woke up with hoarseness. Has "green" nasal drainage.    Review of Systems Past Medical History:  Diagnosis Date  . IBS (irritable bowel syndrome)    h/o this  . Smoker      Social History   Social History  . Marital status: Single    Spouse name: N/A  . Number of children: N/A  . Years of education: N/A   Occupational History  . Not on file.   Social History Main Topics  . Smoking status: Current Every Day Smoker    Packs/day: 0.40    Types: Cigarettes  . Smokeless tobacco: Never Used  . Alcohol use Yes     Comment: very rare (6 pack/year)  . Drug use: No  . Sexual activity: Not on file   Other Topics Concern  . Not on file   Social History Narrative   Caffeine: 1-2 soft drinks/day   Lives with husband and 1 child, 2 dogs.   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 CESAREAN SECTION  2012  . CHOLECYSTECTOMY  2005  . COLONOSCOPY  2006   WNL per pt  . ROTATOR CUFF REPAIR  2004   right  . SHOULDER SURGERY  2012   bone spurs; right    Family History  Problem Relation Age of Onset  . Cancer Paternal Uncle 355    colon  . Cancer Paternal Grandfather [id number]    colon  . CAD Maternal Grandfather 40    MI  . CAD Paternal Grandfather   . Hypertension Mother   . Hyperlipidemia Mother   . Thyroid disease Mother   . Stroke Neg Hx   . Diabetes Neg Hx     Allergies  Allergen Reactions  . Demerol [Meperidine] Nausea And Vomiting    Current Outpatient Prescriptions on File Prior to Visit  Medication Sig Dispense Refill  . NUVARING 0.12-0.015 MG/24HR  vaginal ring PLACE 1 RING VAGINALLY MONTHLY. LEAVE IN PLACE FOR 3 CONSECUTIVE WEEKS, THEN REMOVE FOR 1 WEEK.  6   No current facility-administered medications on file prior to visit.     BP (!) 140/91 (BP Location: Right Arm, Cuff Size: Normal)   Pulse 94   Temp 99.6 F (37.6 C) (Oral)   Resp 16   Ht 5\' 3"  (1.6 m)   Wt 132 lb 3.2 oz (60 kg)   LMP 10/30/2016   SpO2 99%   BMI 23.42 kg/m       Objective:   Physical Exam  Constitutional: She is oriented to person, place, and time. She appears well-developed and well-nourished.  HENT:  Head: Normocephalic and atraumatic.  Right Ear: Tympanic membrane and ear canal normal.  Left Ear: Tympanic membrane and ear canal normal.  Mouth/Throat: Posterior oropharyngeal erythema present. No oropharyngeal exudate or posterior oropharyngeal edema.  Hoarse voice  Cardiovascular: Normal rate, regular rhythm and normal heart sounds.   No murmur heard. Pulmonary/Chest: Effort normal and breath sounds normal. No respiratory distress. She has  no wheezes.  Neurological: She is alert and oriented to person, place, and time.  Skin: Skin is warm and dry.  Psychiatric: She has a normal mood and affect. Her behavior is normal. Judgment and thought content normal.          Assessment & Plan:  Viral illness- flu swab is negative.  Rapid strep negative- will send probe to lab to confirm.  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 I advised her that she is contagious but I hope she is better in about 3 days. If not improved she should let us know.  Pt verbalizes understanding. Supportive measures were reviewed with the patient.   Elevated blood pressure- advised pt to follow up in 1 month for BP recheck with her PCP.   BP Readings from Last 3 Encounters:  11/20/16 (!) 140/91  09/21/16 132/78  10/27/15 (!) 142/95

## 2016-11-20 NOTE — Patient Instructions (Addendum)
Your flu and strep tests are negative.  Continue tylenol or motrin every 6 hours as needed for body aches, fever.  Call if new/worsening symptoms or if symptoms are not improved in 3 days.   Please follow up with Dr. Morton StallGuttierez in 1 month for a blood pressure recheck.   Viral Illness, Adult Viruses are tiny germs that can get into a person's body and cause illness. There are many different types of viruses, and they cause many types of illness. Viral illnesses can range from mild to severe. They can affect various parts of the body. Common illnesses that are caused by a virus include colds and the flu. Viral illnesses also include serious conditions such as HIV/AIDS (human immunodeficiency virus/acquired immunodeficiency syndrome). A few viruses have been linked to certain cancers. What are the causes? Many types of viruses can cause illness. Viruses invade cells in your body, multiply, and cause the infected cells to malfunction or die. When the cell dies, it releases more of the virus. When this happens, you develop symptoms of the illness, and the virus continues to spread to other cells. If the virus takes over the function of the cell, it can cause the cell to divide and grow out of control, as is the case when a virus causes cancer. Different viruses get into the body in different ways. You can get a virus by:  Swallowing food or water that is contaminated with the virus.  Breathing in droplets that have been coughed or sneezed into the air by an infected person.  Touching a surface that has been contaminated with the virus and then touching your eyes, nose, or mouth.  Being bitten by an insect or animal that carries the virus.  Having sexual contact with a person who is infected with the virus.  Being exposed to blood or fluids that contain the virus, either through an open cut or during a transfusion. If a virus enters your body, your body's defense system (immune system) will try to  fight the virus. You may be at higher risk for a viral illness if your immune system is weak. What are the signs or symptoms? Symptoms vary depending on the type of virus and the location of the cells that it invades. Common symptoms of the main types of viral illnesses include: Cold and flu viruses  Fever.  Headache.  Sore throat.  Muscle aches.  Nasal congestion.  Cough. Digestive system (gastrointestinal) viruses  Fever.  Abdominal pain.  Nausea.  Diarrhea. Liver viruses (hepatitis)  Loss of appetite.  Tiredness.  Yellowing of the skin (jaundice). Brain and spinal cord viruses  Fever.  Headache.  Stiff neck.  Nausea and vomiting.  Confusion or sleepiness. Skin viruses  Warts.  Itching.  Rash. Sexually transmitted viruses  Discharge.  Swelling.  Redness.  Rash. How is this treated? Viruses can be difficult to treat because they live within cells. Antibiotic medicines do not treat viruses because these drugs do not get inside cells. Treatment for a viral illness may include:  Resting and drinking plenty of fluids.  Medicines to relieve symptoms. These can include over-the-counter medicine for pain and fever, medicines for cough or congestion, and medicines to relieve diarrhea.  Antiviral medicines. These drugs are available only for certain types of viruses. They may help reduce flu symptoms if taken early. There are also many antiviral medicines for hepatitis and HIV/AIDS. Some viral illnesses can be prevented with vaccinations. A common example is the flu shot. Follow these instructions  at home: Medicines  Take over-the-counter and prescription medicines only as told by your health care provider.  If you were prescribed an antiviral medicine, take it as told by your health care provider. Do not stop taking the medicine even if you start to feel better.  Be aware of when antibiotics are needed and when they are not needed. Antibiotics do  not treat viruses. If your health care provider thinks that you may have a bacterial infection as well as a viral infection, you may get an antibiotic.  Do not ask for an antibiotic prescription if you have been diagnosed with a viral illness. That will not make your illness go away faster.  Frequently taking antibiotics when they are not needed can lead to antibiotic resistance. When this develops, the medicine no longer works against the bacteria that it normally fights. General instructions  Drink enough fluids to keep your urine clear or pale yellow.  Rest as much as possible.  Return to your normal activities as told by your health care provider. Ask your health care provider what activities are safe for you.  Keep all follow-up visits as told by your health care provider. This is important. How is this prevented? Take these actions to reduce your risk of viral infection:  Eat a healthy diet and get enough rest.  Wash your hands often with soap and water. This is especially important when you are in public places. If soap and water are not available, use hand sanitizer.  Avoid close contact with friends and family who have a viral illness.  If you travel to areas where viral gastrointestinal infection is common, avoid drinking water or eating raw food.  Keep your immunizations up to date. Get a flu shot every year as told by your health care provider.  Do not share toothbrushes, nail clippers, razors, or needles with other people.  Always practice safe sex. Contact a health care provider if:  You have symptoms of a viral illness that do not go away.  Your symptoms come back after going away.  Your symptoms get worse. Get help right away if:  You have trouble breathing.  You have a severe headache or a stiff neck.  You have severe vomiting or abdominal pain. This information is not intended to replace advice given to you by your health care provider. Make sure you  discuss any questions you have with your health care provider. Document Released: 04/21/2016 Document Revised: 05/24/2016 Document Reviewed: 04/21/2016 Elsevier Interactive Patient Education  2017 [employer]

## 2016-11-22 ENCOUNTER — Encounter: Payer: Self-pay | Admitting: Family Medicine

## 2016-11-22 ENCOUNTER — Ambulatory Visit (INDEPENDENT_AMBULATORY_CARE_PROVIDER_SITE_OTHER): Payer: 59 | Admitting: Family Medicine

## 2016-11-22 VITALS — BP 112/82 | HR 79 | Temp 98.7°F | Wt 129.5 lb

## 2016-11-22 DIAGNOSIS — N200 Calculus of kidney: Secondary | ICD-10-CM | POA: Insufficient documentation

## 2016-11-22 DIAGNOSIS — J069 Acute upper respiratory infection, unspecified: Secondary | ICD-10-CM

## 2016-11-22 DIAGNOSIS — R05 Cough: Secondary | ICD-10-CM

## 2016-11-22 DIAGNOSIS — R059 Cough, unspecified: Secondary | ICD-10-CM

## 2016-11-22 LAB — CULTURE, GROUP A STREP: ORGANISM ID, BACTERIA: NORMAL

## 2016-11-22 MED ORDER — AZITHROMYCIN 250 MG PO TABS: ORAL_TABLET | ORAL | 0 refills | Status: DC

## 2016-11-22 MED ORDER — HYDROCOD POLST-CPM POLST ER 10-8 MG/5ML PO SUER: 5.0000 mL | Freq: Every evening | ORAL | 0 refills | Status: DC | PRN

## 2016-11-22 MED ORDER — ALBUTEROL SULFATE HFA 108 (90 BASE) MCG/ACT IN AERS: 2.0000 | INHALATION_SPRAY | RESPIRATORY_TRACT | 1 refills | Status: DC | PRN

## 2016-11-22 MED ORDER — BENZONATATE 100 MG PO CAPS: 100.0000 mg | ORAL_CAPSULE | Freq: Three times a day (TID) | ORAL | 0 refills | Status: DC | PRN

## 2016-11-22 NOTE — Progress Notes (Signed)
Pre visit review using our clinic review tool, if applicable. No additional management support is needed unless otherwise documented below in the visit note. 

## 2016-11-22 NOTE — Progress Notes (Signed)
Subjective:    Patient ID: Jacqueline PineBeth [name], female    DOB: [birth year], 30 y.o.   MRN: [id number]  HPI This is a 30 yo female who presents today with continued body aches, nasal drainage, sore throat, headache (frontal over right eye), cough with green sputum. Symptoms for about 1 week. She was seen two days ago and diagnosed with viral uri. Has been taking ibuprofen without relief.   No SOB, no wheeze. Coughing through the night. No history of seasonal allergies or asthma, has required albuterol in the past for bronchitis. 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 She has had to delay returning to work due to illness and is scheduled to go back to work tomorrow evening. Smoker, hasn't smoked recently with illness.    Past Medical History:  Diagnosis Date  . IBS (irritable bowel syndrome)    h/o this  . Smoker    Past Surgical History:  Procedure Laterality Date  . CESAREAN SECTION  2012  . CHOLECYSTECTOMY  2005  . COLONOSCOPY  2006   WNL per pt  . ROTATOR CUFF REPAIR  2004   right  . SHOULDER SURGERY  2012   bone spurs; right   Family History  Problem Relation Age of Onset  . Cancer Paternal Uncle 155    colon  . Cancer Paternal Grandfather 8475    colon  . CAD Maternal Grandfather 40    MI  . CAD Paternal Grandfather   . Hypertension Mother   . Hyperlipidemia Mother   . Thyroid disease Mother   . Stroke Neg Hx   . Diabetes Neg Hx    Social History  Substance Use Topics  . Smoking status: Current Every Day Smoker    Packs/day: 0.40    Types: Cigarettes  . Smokeless tobacco: Never Used  . Alcohol use Yes     Comment: very rare (6 pack/year)      Review of Systems Per HPI    Objective:   Physical Exam  Constitutional: She is oriented to person, place, and time. She appears well-developed and well-nourished. She appears ill.  HENT:  Head: Normocephalic and atraumatic.  Right Ear: Tympanic membrane,  external ear and ear canal normal.  Left Ear: Tympanic membrane, external ear and ear canal normal.  Nose: Mucosal edema and rhinorrhea present. Right sinus exhibits maxillary sinus tenderness. Right sinus exhibits no frontal sinus tenderness. Left sinus exhibits maxillary sinus tenderness. Left sinus exhibits no frontal sinus tenderness.  Mouth/Throat: Uvula is midline and mucous membranes are normal. Posterior oropharyngeal erythema present. No oropharyngeal exudate or posterior oropharyngeal edema.  Eyes: Conjunctivae are normal.  Neck: Normal range of motion. Neck supple.  Cardiovascular: Normal rate, regular rhythm and normal heart sounds.   Pulmonary/Chest: Effort normal and breath sounds normal.  Frequent, dry cough.   Lymphadenopathy:    She has no cervical adenopathy.  Neurological: She is alert and oriented to person, place, and time.  Skin: Skin is warm and dry.  Psychiatric: She has a normal mood and affect. Her behavior is normal. Judgment and thought content normal.  Vitals reviewed.     BP 112/82   Pulse 79   Temp 98.7 F (37.1 C) (Oral)   Wt 129 lb 8 oz (58.7 kg)   LMP 10/30/2016   SpO2 98%   BMI 22.94 kg/m  Wt Readings from Last 3 Encounters:  11/22/16 129 lb  8 oz (58.7 kg)  11/20/16 132 lb 3.2 oz (60 kg)  09/21/16 128 lb 8 oz (58.3 kg)       Assessment & Plan:  1. Acute upper respiratory infection - given duration and smoking history will cover for bacterial infection - azithromycin (ZITHROMAX) 250 MG tablet; Take 2 tabs PO x 1 dose, then 1 tab PO QD x 4 days  Dispense: 6 tablet; Refill: 0 - albuterol (PROVENTIL HFA;VENTOLIN HFA) 108 (90 Base) MCG/ACT inhaler; Inhale 2 puffs into the lungs every 4 (four) hours as needed for wheezing or shortness of breath (cough, shortness of breath or wheezing.).  Dispense: 1 Inhaler; Refill: 1 - RTC precautions reviewed.   2. Cough - chlorpheniramine-HYDROcodone (TUSSIONEX PENNKINETIC ER) 10-8 MG/5ML SUER; Take 5 mLs by  mouth at bedtime as needed for cough.  Dispense: 115 mL; Refill: 0 - benzonatate (TESSALON) 100 MG capsule; Take 1-2 capsules (100-200 mg total) by mouth 3 (three) times daily as needed for cough.  Dispense: 40 capsule; Refill: 0 - albuterol (PROVENTIL HFA;VENTOLIN HFA) 108 (90 Base) MCG/ACT inhaler; Inhale 2 puffs into the lungs every 4 (four) hours as needed for wheezing or shortness of breath (cough, shortness of breath or wheezing.).  Dispense: 1 Inhaler; Refill: 1   Olean Reeeborah Lauryl Seyer, FNP-BC  [facility]  11/22/2016 3:39 PM

## 2016-11-22 NOTE — Patient Instructions (Signed)
Upper Respiratory Infection, Adult Most upper respiratory infections (URIs) are a viral infection of the air passages leading to the lungs. A URI affects the nose, throat, and upper air passages. The most common type of URI is nasopharyngitis and is typically referred to as "the common cold." URIs run their course and usually go away on their own. Most of the time, a URI does not require medical attention, but sometimes a bacterial infection in the upper airways can follow a viral infection. This is called a secondary infection. Sinus and middle ear infections are common types of secondary upper respiratory infections. Bacterial pneumonia can also complicate a URI. A URI can worsen asthma and chronic obstructive pulmonary disease (COPD). Sometimes, these complications can require emergency medical care and may be life threatening. What are the causes? Almost all URIs are caused by viruses. A virus is a type of germ and can spread from one person to another. What increases the risk? You may be at risk for a URI if:  You smoke.  You have chronic heart or lung disease.  You have a weakened defense (immune) system.  You are very young or very old.  You have nasal allergies or asthma.  You work in crowded or poorly ventilated areas.  You work in health care facilities or schools.  What are the signs or symptoms? Symptoms typically develop 2-3 days after you come in contact with a cold virus. Most viral URIs last 7-10 days. However, viral URIs from the influenza virus (flu virus) can last 14-18 days and are typically more severe. Symptoms may include:  Runny or stuffy (congested) nose.  Sneezing.  Cough.  Sore throat.  Headache.  Fatigue.  Fever.  Loss of appetite.  Pain in your forehead, behind your eyes, and over your cheekbones (sinus pain).  Muscle aches.  How is this diagnosed? Your health care provider may diagnose a URI by:  Physical exam.  Tests to check that your  symptoms are not due to another condition such as: ? Strep throat. ? Sinusitis. ? Pneumonia. ? Asthma.  How is this treated? A URI goes away on its own with time. It cannot be cured with medicines, but medicines may be prescribed or recommended to relieve symptoms. Medicines may help:  Reduce your fever.  Reduce your cough.  Relieve nasal congestion.  Follow these instructions at home:  Take medicines only as directed by your health care provider.  Gargle warm saltwater or take cough drops to comfort your throat as directed by your health care provider.  Use a warm mist humidifier or inhale steam from a shower to increase air moisture. This may make it easier to breathe.  Drink enough fluid to keep your urine clear or pale yellow.  Eat soups and other clear broths and maintain good nutrition.  Rest as needed.  Return to work when your temperature has returned to normal or as your health care provider advises. You may need to stay home longer to avoid infecting others. You can also use a face mask and careful hand washing to prevent spread of the virus.  Increase the usage of your inhaler if you have asthma.  Do not use any tobacco products, including cigarettes, chewing tobacco, or electronic cigarettes. If you need help quitting, ask your health care provider. How is this prevented? The best way to protect yourself from getting a cold is to practice good hygiene.  Avoid oral or hand contact with people with cold symptoms.  Wash your   hands often if contact occurs.  There is no clear evidence that vitamin C, vitamin E, echinacea, or exercise reduces the chance of developing a cold. However, it is always recommended to get plenty of rest, exercise, and practice good nutrition. Contact a health care provider if:  You are getting worse rather than better.  Your symptoms are not controlled by medicine.  You have chills.  You have worsening shortness of breath.  You have  brown or red mucus.  You have yellow or brown nasal discharge.  You have pain in your face, especially when you bend forward.  You have a fever.  You have swollen neck glands.  You have pain while swallowing.  You have white areas in the back of your throat. Get help right away if:  You have severe or persistent: ? Headache. ? Ear pain. ? Sinus pain. ? Chest pain.  You have chronic lung disease and any of the following: ? Wheezing. ? Prolonged cough. ? Coughing up blood. ? A change in your usual mucus.  You have a stiff neck.  You have changes in your: ? Vision. ? Hearing. ? Thinking. ? Mood. This information is not intended to replace advice given to you by your health care provider. Make sure you discuss any questions you have with your health care provider. Document Released: 06/06/2001 Document Revised: 08/13/2016 Document Reviewed: 03/18/2014 Elsevier Interactive Patient Education  2017 Elsevier Inc.  

## 2016-11-23 ENCOUNTER — Telehealth: Payer: Self-pay | Admitting: Family Medicine

## 2016-11-23 ENCOUNTER — Encounter: Payer: Self-pay | Admitting: Family Medicine

## 2016-11-23 NOTE — Telephone Encounter (Signed)
Pt dropped off request for medial information In rx tower up front for debbie

## 2016-11-23 NOTE — Telephone Encounter (Signed)
Form completed and returned to Robin's desk.

## 2016-11-27 NOTE — Telephone Encounter (Signed)
Paperwork faxed °Copy for pt  °Copy for file °Copy for scan °Pt aware  °

## 2017-02-19 ENCOUNTER — Encounter: Payer: Self-pay | Admitting: *Deleted

## 2017-02-19 ENCOUNTER — Telehealth: Payer: Self-pay | Admitting: Family Medicine

## 2017-02-19 ENCOUNTER — Ambulatory Visit (INDEPENDENT_AMBULATORY_CARE_PROVIDER_SITE_OTHER)
Admission: EM | Admit: 2017-02-19 | Discharge: 2017-02-19 | Disposition: A | Discharge status: Other Acute Inpatient Hospital | Payer: 59 | Source: Home / Self Care | Attending: Family Medicine | Admitting: Family Medicine

## 2017-02-19 ENCOUNTER — Encounter: Payer: Self-pay | Admitting: Emergency Medicine

## 2017-02-19 ENCOUNTER — Emergency Department
Admission: EM | Admit: 2017-02-19 | Discharge: 2017-02-19 | Disposition: A | Discharge status: Home / Self Care | Payer: 59 | Attending: Emergency Medicine | Admitting: Emergency Medicine

## 2017-02-19 DIAGNOSIS — R55 Syncope and collapse: Secondary | ICD-10-CM

## 2017-02-19 DIAGNOSIS — Z79899 Other long term (current) drug therapy: Secondary | ICD-10-CM | POA: Insufficient documentation

## 2017-02-19 DIAGNOSIS — R51 Headache: Secondary | ICD-10-CM | POA: Diagnosis not present

## 2017-02-19 DIAGNOSIS — R519 Headache, unspecified: Secondary | ICD-10-CM

## 2017-02-19 DIAGNOSIS — F17210 Nicotine dependence, cigarettes, uncomplicated: Secondary | ICD-10-CM

## 2017-02-19 DIAGNOSIS — G4489 Other headache syndrome: Secondary | ICD-10-CM

## 2017-02-19 LAB — HCG, QUANTITATIVE, PREGNANCY: hCG, Beta Chain, Quant, S: <1 m[IU]/mL (ref <5)

## 2017-02-19 MED ORDER — DIPHENHYDRAMINE HCL 50 MG/ML IJ SOLN
50.0000 mg | Freq: Once | INTRAMUSCULAR | Status: AC
  Administered 2017-02-19: 50 mg via INTRAVENOUS
  Filled 2017-02-19: qty 1

## 2017-02-19 MED ORDER — SODIUM CHLORIDE 0.9 % IV BOLUS (SEPSIS)
1000.0000 mL | Freq: Once | INTRAVENOUS | Status: AC
  Administered 2017-02-19: 1000 mL via INTRAVENOUS

## 2017-02-19 MED ORDER — ACETAMINOPHEN 500 MG PO TABS
1000.0000 mg | ORAL_TABLET | Freq: Once | ORAL | Status: AC
  Administered 2017-02-19: 1000 mg via ORAL
  Filled 2017-02-19: qty 2

## 2017-02-19 MED ORDER — DEXAMETHASONE SODIUM PHOSPHATE 10 MG/ML IJ SOLN
10.0000 mg | Freq: Once | INTRAMUSCULAR | Status: AC
  Administered 2017-02-19: 10 mg via INTRAVENOUS
  Filled 2017-02-19: qty 1

## 2017-02-19 MED ORDER — PROCHLORPERAZINE EDISYLATE 5 MG/ML IJ SOLN
10.0000 mg | Freq: Once | INTRAMUSCULAR | Status: AC
  Administered 2017-02-19: 10 mg via INTRAVENOUS
  Filled 2017-02-19: qty 2

## 2017-02-19 NOTE — ED Triage Notes (Signed)
C/O headache, onset of symptoms this morning.  C/O pain to forehead radiating back to top of head.  Aleve and Excedrin migraine taken today without relief.

## 2017-02-19 NOTE — ED Notes (Signed)
Pt states HA on forehead and sides of head that began this AM, pt visitor states "it's just gotten worse and worse throughout the day." Pt states light, sound, and movement sensitivity but states she is unsure of visual changes. Pt denies hx of HA or migraines. Alert and oriented, brought back in wheelchair. Has towel covering face and lights dimmed for comfort.

## 2017-02-19 NOTE — Discharge Instructions (Signed)
Recommend further evaluation and management at Emergency Department

## 2017-02-19 NOTE — Telephone Encounter (Signed)
[facility] 

## 2017-02-19 NOTE — Discharge Instructions (Signed)
If you feel another headache coming on and it is safe to take 1000 mg of Tylenol and 600 mg of ibuprofen together up to 3 times a day. Caffeine is also extremely effective in stopping headaches. Return to the emergency department for any new or worsening symptoms and follow up with your primary care physician as needed.

## 2017-02-19 NOTE — ED Triage Notes (Signed)
Patient started passed out completely 2 weeks ago. Severe headache symptom started today. Patient does not have a history of LOC or headaches.

## 2017-02-19 NOTE — Telephone Encounter (Signed)
Patient Name: Jacqueline PineBETH Jacqueline [name]  DOB: 02-13-1986    Initial Comment Caller states having bad headaches and passing out spells; most recently passed out Saturday night;    Nurse Assessment  Nurse: Stefano GaulStringer, RN, Dwana CurdVera Date/Time (Eastern Time): 02/19/2017 2:51:09 PM  Confirm and document reason for call. If symptomatic, describe symptoms. ---Caller states she has a severe headache which started this am. BP 168/96. Has "spells" where she has tunnel vision, she shakes, gets light headed and feels like she is going to pass out. Last episode was Saturday night. Has taken Aleve and excedrin and nothing is helping. Pain level 10.  Does the patient have any new or worsening symptoms? ---Yes  Will a triage be completed? ---Yes  Related visit to physician within the last 2 weeks? ---No  Does the PT have any chronic conditions? (i.e. diabetes, asthma, etc.) ---No  Is the patient pregnant or possibly pregnant? (Ask all females between the ages of 5412-55) ---No  Is this a behavioral health or substance abuse call? ---No     Guidelines    Guideline Title Affirmed Question Affirmed Notes  Headache [1] SEVERE headache (e.g., excruciating) AND [2] "worst headache" of life    Final Disposition User   Go to ED Now (or PCP triage) Stefano GaulStringer, RN, Vera    Comments  pt [place] [facility] [place] [facility] [place]   Referrals  GO TO FACILITY UNDECIDED   Disagree/Comply: Comply

## 2017-02-19 NOTE — Telephone Encounter (Signed)
Noted. plz call tomorrow for an update.  

## 2017-02-19 NOTE — ED Provider Notes (Signed)
[facility] Emergency Department Provider Note  ____________________________________________   First MD Initiated Contact with Patient 02/19/17 1720     (approximate)  I have reviewed the triage vital signs and the nursing notes.   HISTORY  Chief Complaint No chief complaint on file.   HPI Jacqueline [name] is a 31 y.o. female who comes to the emergency department with gradual onset not maximal onset bifrontal throbbing headache that first began when she awoke from sleep this morning. It is been slowly progressive throughout the day. She initially took an Aleve this morning and then an Excedrin Migraine and it has not helped the pain. She went to urgent care this afternoon who advised her to come to the emergency department because she "might need a CT scan". The patient reports photophobia. She denies neck pain or neck stiffness. No fevers or chills. No recent illness. No influenza-like symptoms. No numbness or weakness. She's never had a migraine headache before.   Past Medical History:  Diagnosis Date  . IBS (irritable bowel syndrome)    h/o this  . Smoker     Patient Active Problem List   Diagnosis Date Noted  . Kidney stones 11/22/2016  . Healthcare maintenance 02/13/2013  . Smoker   . IBS (irritable bowel syndrome)     Past Surgical History:  Procedure Laterality Date  . CESAREAN SECTION  2012  . CHOLECYSTECTOMY  2005  . COLONOSCOPY  2006   WNL per pt  . ROTATOR CUFF REPAIR  2004   right  . SHOULDER SURGERY  2012   bone spurs; right    Prior to Admission medications   Medication Sig Start Date End Date Taking? Authorizing Provider  albuterol (PROVENTIL HFA;VENTOLIN HFA) 108 (90 Base) MCG/ACT inhaler Inhale 2 puffs into the lungs every 4 (four) hours as needed for wheezing or shortness of breath (cough, shortness of breath or wheezing.). 11/22/16   Emi Belfasteborah B Gessner, FNP  azithromycin (ZITHROMAX) 250 MG tablet Take 2 tabs PO x 1 dose, then  1 tab PO QD x 4 days 11/22/16   Emi Belfasteborah B Gessner, FNP  benzonatate (TESSALON) 100 MG capsule Take 1-2 capsules (100-200 mg total) by mouth 3 (three) times daily as needed for cough. 11/22/16   Emi Belfasteborah B Gessner, FNP  chlorpheniramine-HYDROcodone [facility](TUSSIONEX PENNKINETIC ER) 10-8 MG/5ML SUER Take 5 mLs by mouth at bedtime as needed for cough. 11/22/16   Emi Belfasteborah B Gessner, FNP  NUVARING 0.12-0.015 MG/24HR vaginal ring PLACE 1 RING VAGINALLY MONTHLY. LEAVE IN PLACE FOR 3 CONSECUTIVE WEEKS, THEN REMOVE FOR 1 WEEK. 09/26/15   Historical Provider, MD    Allergies Demerol [meperidine]  Family History  Problem Relation Age of Onset  . Cancer Paternal Uncle 6655    colon  . Cancer Paternal Grandfather 9175    colon  . CAD Paternal Grandfather   . CAD Maternal Grandfather 40    MI  . Hypertension Mother   . Hyperlipidemia Mother   . Thyroid disease Mother   . Stroke Neg Hx   . Diabetes Neg Hx     Social History Social History  Substance Use Topics  . Smoking status: Current Every Day Smoker    Packs/day: 1.00    Types: Cigarettes  . Smokeless tobacco: Never Used  . Alcohol use Yes     Comment: very rare (6 pack/year)    Review of Systems Constitutional: No fever/chills Eyes: No visual changes. ENT: No sore throat. Cardiovascular: Denies chest pain. Respiratory: Denies shortness of breath.  Gastrointestinal: No abdominal pain.  No nausea, no vomiting.  No diarrhea.  No constipation. Genitourinary: Negative for dysuria. Musculoskeletal: Negative for back pain. Skin: Negative for rash. Neurological: Positive for headaches  10-point ROS otherwise negative.  ____________________________________________   PHYSICAL EXAM:  VITAL SIGNS: ED Triage Vitals  Enc Vitals Group     BP 02/19/17 1635 (!) 161/109     Pulse Rate 02/19/17 1635 99     Resp 02/19/17 1635 18     Temp 02/19/17 1635 98.6 F (37 C)     Temp Source 02/19/17 1635 Oral     SpO2 02/19/17 1635 99 %     Weight --       Height --      Head Circumference --      Peak Flow --      Pain Score 02/19/17 1634 10     Pain Loc --      Pain Edu? --      Excl. in GC? --     Constitutional: Alert and oriented x 4 Appears uncomfortable lying in a dark room curled on her side holding her head Eyes: PERRL EOMI. pupils midrange and equal brisk bilaterally Head: Atraumatic. Nose: No congestion/rhinnorhea. Mouth/Throat: No trismus Neck: No stridor.   Cardiovascular: Normal rate, regular rhythm. Grossly normal heart sounds.  Good peripheral circulation. Respiratory: Normal respiratory effort.  No retractions. Lungs CTAB and moving good air Gastrointestinal: Soft nondistended nontender no rebound no guarding no peritonitis no McBurney's tenderness negative Rovsing's no costovertebral tenderness negative Murphy's Musculoskeletal: No lower extremity edema   Neurologic:  Normal speech and language. No gross focal neurologic deficits are appreciated. Skin:  Skin is warm, dry and intact. No rash noted. Psychiatric: Mood and affect are normal. Speech and behavior are normal.  ____________________________________________   LABS (all labs ordered are listed, but only abnormal results are displayed)  Labs Reviewed  HCG, QUANTITATIVE, PREGNANCY   ____________________________________________  EKG  ED ECG REPORT I, Merrily Brittle, the attending physician, personally viewed and interpreted this ECG.  Date: 02/19/2017 EKG Time:1852 Rate: 67 Rhythm: normal sinus rhythm QRS Axis: normal Intervals: normal ST/T Wave abnormalities: normal Conduction Disturbances: none Narrative Interpretation: unremarkable concerning signs for syncope no Brugada no AVRD  ____________________________________________  RADIOLOGY   ____________________________________________   PROCEDURES  Procedure(s) performed: no  Procedures  Critical Care performed: no  ____________________________________________   INITIAL IMPRESSION  / ASSESSMENT AND PLAN / ED COURSE  Pertinent labs & imaging results that were available during my care of the patient were reviewed by me and considered in my medical decision making (see chart for details).       ----------------------------------------- 6:47 PM on 02/19/2017 -----------------------------------------  The patient's headache is now gone and she remains neurologically intact. She is asking to go home. She also mentions at this point that she has had multiple episodes of near syncope and one episode of syncope over the past several months. It always occurs when she is standing she feels lightheaded and nauseated and then has to sit down. I'll check a single EKG before she goes.  EKG was concerning signs for cardiogenic syncope. The patient is medically stable for outpatient management understands and agrees the plan. ____________________________________________   FINAL CLINICAL IMPRESSION(S) / ED DIAGNOSES  Final diagnoses:  Other headache syndrome      NEW MEDICATIONS STARTED DURING THIS VISIT:  Discharge Medication List as of 02/19/2017  7:37 PM       Note:  This document was prepared  using Conservation officer, historic buildings and may include unintentional dictation errors.     Merrily Brittle, MD 02/19/17 [phone]

## 2017-02-19 NOTE — ED Provider Notes (Addendum)
[facility]    CSN: [id number] Arrival date & time: 02/19/17  1516     History   Chief Complaint Chief Complaint  Patient presents with  . Loss of Consciousness  . Headache    HPI Jacqueline [name] is a 31 y.o. female.   31 yo female with a c/o "severe headache" that started this morning. Patient denies prior h/o migraine or headache disorder. States her headache is 9/10 and states this is her worst headache ever. Has been progressively worsening since this morning when she woke up and is associated with photophobia. Denies any nausea, vomiting, fevers, chills, neck stiffness, numbness/tingling.  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 Since then has had 2 other separate episodes of near syncope, where she feels she's going to pass out, but sits down and symptoms resolve. Denies any chest pains or palpitations with these episodes. The last episode was 2 days ago at home.    The history is provided by the patient.    Past Medical History:  Diagnosis Date  . IBS (irritable bowel syndrome)    h/o this  . Smoker     Patient Active Problem List   Diagnosis Date Noted  . Kidney stones 11/22/2016  . Healthcare maintenance 02/13/2013  . Smoker   . IBS (irritable bowel syndrome)     Past Surgical History:  Procedure Laterality Date  . CESAREAN SECTION  2012  . CHOLECYSTECTOMY  2005  . COLONOSCOPY  2006   WNL per pt  . ROTATOR CUFF REPAIR  2004   right  . SHOULDER SURGERY  2012   bone spurs; right    OB History    No data available       Home Medications    Prior to Admission medications   Medication Sig Start Date End Date Taking? Authorizing Provider  NUVARING 0.12-0.015 MG/24HR vaginal ring PLACE 1 RING VAGINALLY MONTHLY. LEAVE IN PLACE FOR 3 CONSECUTIVE WEEKS, THEN REMOVE FOR 1 WEEK. 09/26/15  Yes Historical Provider, MD  albuterol (PROVENTIL HFA;VENTOLIN HFA)  108 (90 Base) MCG/ACT inhaler Inhale 2 puffs into the lungs every 4 (four) hours as needed for wheezing or shortness of breath (cough, shortness of breath or wheezing.). 11/22/16   Emi Belfast, FNP  azithromycin (ZITHROMAX) 250 MG tablet Take 2 tabs PO x 1 dose, then 1 tab PO QD x 4 days 11/22/16   Emi Belfast, FNP  benzonatate (TESSALON) 100 MG capsule Take 1-2 capsules (100-200 mg total) by mouth 3 (three) times daily as needed for cough. 11/22/16   Emi Belfast, FNP  chlorpheniramine-HYDROcodone [facility] PENNKINETIC ER) 10-8 MG/5ML SUER Take 5 mLs by mouth at bedtime as needed for cough. 11/22/16   Emi Belfast, FNP    Family History Family History  Problem Relation Age of Onset  . Cancer Paternal Uncle 53    colon  . Cancer Paternal Grandfather 70    colon  . CAD Paternal Grandfather   . CAD Maternal Grandfather 40    MI  . Hypertension Mother   . Hyperlipidemia Mother   . Thyroid disease Mother   . Stroke Neg Hx   . Diabetes Neg Hx     Social History Social History  Substance Use Topics  . Smoking status: Current Every Day Smoker    Packs/day: 1.00    Types: Cigarettes  . Smokeless tobacco: Never Used  .  Alcohol use Yes     Comment: very rare (6 pack/year)     Allergies   Demerol [meperidine]   Review of Systems Review of Systems   Physical Exam Triage Vital Signs ED Triage Vitals  Enc Vitals Group     BP 02/19/17 1529 (!) 152/100     Pulse Rate 02/19/17 1529 73     Resp 02/19/17 1529 16     Temp 02/19/17 1529 98 F (36.7 C)     Temp Source 02/19/17 1529 Oral     SpO2 02/19/17 1529 100 %     Weight 02/19/17 1530 125 lb (56.7 kg)     Height 02/19/17 1530 5\' 3"  (1.6 m)     Head Circumference --      Peak Flow --      Pain Score 02/19/17 1532 9     Pain Loc --      Pain Edu? --      Excl. in GC? --    No data found.   Updated Vital Signs BP (!) 152/100 (BP Location: Left Arm)   Pulse 73   Temp 98 F (36.7 C) (Oral)    Resp 16   Ht 5\' 3"  (1.6 m)   Wt 125 lb (56.7 kg)   LMP 02/05/2017   SpO2 100%   BMI 22.14 kg/m   Visual Acuity Right Eye Distance:   Left Eye Distance:   Bilateral Distance:    Right Eye Near:   Left Eye Near:    Bilateral Near:     Physical Exam  Constitutional: She is oriented to person, place, and time. She appears well-developed and well-nourished. No distress.  HENT:  Head: Normocephalic and atraumatic.  Right Ear: Tympanic membrane, external ear and ear canal normal.  Left Ear: Tympanic membrane, external ear and ear canal normal.  Nose: No mucosal edema, rhinorrhea, nose lacerations, sinus tenderness, nasal deformity, septal deviation or nasal septal hematoma. No epistaxis.  No foreign bodies. Right sinus exhibits no maxillary sinus tenderness and no frontal sinus tenderness. Left sinus exhibits no maxillary sinus tenderness and no frontal sinus tenderness.  Mouth/Throat: Uvula is midline, oropharynx is clear and moist and mucous membranes are normal. No oropharyngeal exudate.  Eyes: Conjunctivae and EOM are normal. Pupils are equal, round, and reactive to light. Right eye exhibits no discharge. Left eye exhibits no discharge. No scleral icterus.  Neck: Normal range of motion. Neck supple. No thyromegaly present.  Cardiovascular: Normal rate, regular rhythm and normal heart sounds.   Pulmonary/Chest: Effort normal and breath sounds normal. No respiratory distress. She has no wheezes. She has no rales.  Lymphadenopathy:    She has no cervical adenopathy.  Neurological: She is alert and oriented to person, place, and time. She displays normal reflexes. No cranial nerve deficit or sensory deficit. She exhibits normal muscle tone. Coordination normal.  Skin: She is not diaphoretic.  Nursing note and vitals reviewed.    UC Treatments / Results  Labs (all labs ordered are listed, but only abnormal results are displayed) Labs Reviewed - No data to display  EKG  EKG  Interpretation None       Radiology No results found.  Procedures Procedures (including critical care time)  Medications Ordered in UC Medications - No data to display   Initial Impression / Assessment and Plan / UC Course  I have reviewed the triage vital signs and the nursing notes.  Pertinent labs & imaging results that were available during my care of  the patient were reviewed by me and considered in my medical decision making (see chart for details).       Final Clinical Impressions(s) / UC Diagnoses   Final diagnoses:  Syncope and collapse  Sudden onset of severe headache    New Prescriptions Discharge Medication List as of 02/19/2017  3:57 PM     1. Possible etiologies/diagnoses reviewed with patient and husband 2. Due to patient's severe/worst headache and recent syncopal and pre-syncopal episodes, recommend patient go to ED for further evaluation and management. Patient and husband verbalize understanding and will proceed by private vehicle to [facility] ED. Report called to triage RN at [facility] ED.    Payton Mccallum, MD 02/19/17 2209    Payton Mccallum, MD 02/19/17 [phone]

## 2017-02-20 NOTE — Telephone Encounter (Signed)
HA is mostly gone since ER visit. Has a follow up with Jae DireKate tomorrow due to it being your half-day.

## 2017-02-20 NOTE — Telephone Encounter (Signed)
Message left for patient to return my call.  

## 2017-02-21 ENCOUNTER — Ambulatory Visit (INDEPENDENT_AMBULATORY_CARE_PROVIDER_SITE_OTHER): Payer: 59 | Admitting: Primary Care

## 2017-02-21 ENCOUNTER — Encounter: Payer: Self-pay | Admitting: Primary Care

## 2017-02-21 VITALS — BP 114/66 | HR 80 | Temp 98.1°F | Ht 63.0 in | Wt 134.0 lb

## 2017-02-21 DIAGNOSIS — R55 Syncope and collapse: Secondary | ICD-10-CM | POA: Diagnosis not present

## 2017-02-21 LAB — COMPREHENSIVE METABOLIC PANEL
ALBUMIN: 4 g/dL (ref 3.5–5.2)
ALK PHOS: 36 U/L — AB (ref 39–117)
ALT: 22 U/L (ref 0–35)
AST: 20 U/L (ref 0–37)
BILIRUBIN TOTAL: 0.2 mg/dL (ref 0.2–1.2)
BUN: 12 mg/dL (ref 6–23)
CO2: 26 mEq/L (ref 19–32)
CREATININE: 1.04 mg/dL (ref 0.40–1.20)
Calcium: 8.8 mg/dL (ref 8.4–10.5)
Chloride: 110 mEq/L (ref 96–112)
GFR: 64.85 mL/min (ref >60.00)
Glucose, Bld: 82 mg/dL (ref 70–99)
POTASSIUM: 3.6 meq/L (ref 3.5–5.1)
SODIUM: 142 meq/L (ref 135–145)
TOTAL PROTEIN: 6.3 g/dL (ref 6.0–8.3)

## 2017-02-21 LAB — CBC
HEMATOCRIT: 38.5 % (ref 36.0–46.0)
HEMOGLOBIN: 13 g/dL (ref 12.0–15.0)
MCHC: 33.6 g/dL (ref 30.0–36.0)
MCV: 93 fl (ref 78.0–100.0)
Platelets: 235 10*3/uL (ref 150.0–400.0)
RBC: 4.15 Mil/uL (ref 3.87–5.11)
RDW: 12.5 % (ref 11.5–15.5)
WBC: 11.2 10*3/uL — AB (ref 4.0–10.5)

## 2017-02-21 NOTE — Patient Instructions (Signed)
Your symptoms could be related to vertigo.  Try meclizine 25 mg tablets for dizziness. This may be purchased over the counter.   Complete lab work prior to leaving today. I will notify you of your results once received.   Ensure you are consuming 64 ounces of water daily.  Please notify myself or Dr. Sharen HonesGutierrez if these symptoms return.  It was a pleasure meeting you!

## 2017-02-21 NOTE — Progress Notes (Signed)
Pre visit review using our clinic review tool, if applicable. No additional management support is needed unless otherwise documented below in the visit note. 

## 2017-02-21 NOTE — Progress Notes (Signed)
Subjective:    Patient ID: Jacqueline [name], female    DOB: [birth year], 31 y.o.   MRN: [id number]  HPI  Ms. Jacqueline [name] is a 31 year old female who presents today for emergency department follow up.  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 Since then she'd had two other episodes of near syncope which passed with rest. Her headache was located to the bifrontal lobes with photophobia and nausea. Given her symptoms she was sent to the ED for further work up.  [facility] ED on 02/19/17 with a chief complaint of bifrontal headache and syncope/near syncope. During her stay in the ED she underwent ECG (NSR) and unremarkable. She was provided with a migraine cocktail in the ED with relief in headache. She was noted to be hypertensive in the ED. She was sent home given resolve of her headache and normal ECG.   Since her discharge home from the ED she has had a minor headache that is located to the left occipital lobe with mild photophobia. Overall, significant improvement. Her headache has been present for the past 2 days total. She denies nausea and vomiting.   During her first episode of syncope and subsequent episodes of near syncope she experiences symptoms of sweats, she feels shaky, feel weak, and feels as though the room is spinning. She was caught during her initial syncopal episode and therefore did not hit the ground. She's not had any near syncopal episodes since her ED evaluation. She did have these symptoms occur 10 years ago, none since. She denies chest pain, weakness, palpitations, fatigue. No family history of syncope. TSH in September 2017 unremarkable.    She drinks 1 cup of coffee and a small bottle of mountain dew daily. She spoke with her older sister who did have these symptoms before but also had seizures. 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  Review of Systems  Constitutional: Negative for fatigue and fever.  Respiratory: Negative for shortness of breath.   Cardiovascular: Negative for chest pain and palpitations.  Endocrine: Negative for heat intolerance.  Neurological: Positive for headaches. Negative for dizziness and weakness.  Psychiatric/Behavioral: The patient is not nervous/anxious.        Past Medical History:  Diagnosis Date  . IBS (irritable bowel syndrome)    h/o this  . Smoker      Social History   Social History  . Marital status: Single    Spouse name: N/A  . Number of children: N/A  . Years of education: N/A   Occupational History  . Not on file.   Social History Main Topics  . Smoking status: Current Every Day Smoker    Packs/day: 1.00    Types: Cigarettes  . Smokeless tobacco: Never Used  . Alcohol use Yes     Comment: very rare (6 pack/year)  . Drug use: No  . Sexual activity: Not on file   Other Topics Concern  . Not on file   Social History Narrative   Caffeine: 1-2 soft drinks/day   Lives with husband and 1 child, 2 dogs.   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 CESAREAN SECTION  2012  . CHOLECYSTECTOMY  2005  . COLONOSCOPY  2006   WNL per pt  . ROTATOR CUFF REPAIR  2004   right  . SHOULDER SURGERY  2012   bone spurs; right    Family History  Problem Relation Age of Onset  . Cancer Paternal Uncle 34    colon  . Cancer Paternal Grandfather 52    colon  . CAD Paternal Grandfather   . CAD Maternal Grandfather 40    MI  . Hypertension Mother   . Hyperlipidemia Mother   . Thyroid disease Mother   . Stroke Neg Hx   . Diabetes Neg Hx     Allergies  Allergen Reactions  .  Demerol [Meperidine] Nausea And Vomiting    Current Outpatient Prescriptions on File Prior to Visit  Medication Sig Dispense Refill  . NUVARING 0.12-0.015 MG/24HR vaginal ring PLACE 1 RING VAGINALLY MONTHLY. LEAVE IN PLACE FOR 3 CONSECUTIVE WEEKS, THEN REMOVE FOR 1 WEEK.  6   No current facility-administered medications on file prior to visit.     BP 114/66   Pulse 80   Temp 98.1 F (36.7 C) (Oral)   Ht 5\' 3"  (1.6 m)   Wt 134 lb (60.8 kg)   LMP 02/05/2017   SpO2 99%   BMI 23.74 kg/m    Objective:   Physical Exam  Constitutional: She is oriented to person, place, and time. She appears well-nourished.  Neck: Neck supple.  Cardiovascular: Normal rate, regular rhythm and normal heart sounds.   No murmur heard. Pulmonary/Chest: Effort normal and breath sounds normal.  Neurological: She is alert and oriented to person, place, and time.  Skin: Skin is warm and dry.  Psychiatric: She has a normal mood and affect.          Assessment & Plan:  All Urgent Care and ED notes reviewed.

## 2017-02-21 NOTE — Assessment & Plan Note (Addendum)
Syncope 2 weeks ago, two episodes of near syncope since. ECG unremarkable from ED visit. Orthostatic vitals completed in clinic today, negative. No abnormal heart sounds. Could be vertigo, will have her try Meclizine if symptoms return. Will get CBC and CMP today. TSH unremarkable in 08/2016. Discussed to notify if symptoms return, consider cardiology referral.

## 2017-02-22 ENCOUNTER — Other Ambulatory Visit: Payer: Self-pay | Admitting: Primary Care

## 2017-02-22 ENCOUNTER — Telehealth: Payer: Self-pay | Admitting: Primary Care

## 2017-02-22 DIAGNOSIS — R55 Syncope and collapse: Secondary | ICD-10-CM

## 2017-02-22 NOTE — Telephone Encounter (Signed)
Patient returned your call.

## 2017-02-22 NOTE — Telephone Encounter (Signed)
Message left for patient to return my call.  

## 2017-02-22 NOTE — Telephone Encounter (Signed)
Spoken and notified patient of Kate's comments. Patient verbalized understanding. 

## 2017-02-22 NOTE — Telephone Encounter (Signed)
Please notify patient that I discussed her case with Dr. Sharen HonesGutierrez and we feel she needs evaluation by a cardiologist. She will likely need to wear a Holter monitor and go through additional testing. I've already placed the referral.

## 2017-03-01 ENCOUNTER — Ambulatory Visit (INDEPENDENT_AMBULATORY_CARE_PROVIDER_SITE_OTHER): Payer: 59 | Admitting: Cardiology

## 2017-03-01 ENCOUNTER — Other Ambulatory Visit (INDEPENDENT_AMBULATORY_CARE_PROVIDER_SITE_OTHER): Payer: 59

## 2017-03-01 ENCOUNTER — Encounter: Payer: Self-pay | Admitting: Cardiology

## 2017-03-01 ENCOUNTER — Encounter: Payer: Self-pay | Admitting: *Deleted

## 2017-03-01 VITALS — BP 128/84 | HR 70 | Ht 63.0 in | Wt 131.5 lb

## 2017-03-01 DIAGNOSIS — R55 Syncope and collapse: Secondary | ICD-10-CM

## 2017-03-01 DIAGNOSIS — R9431 Abnormal electrocardiogram [ECG] [EKG]: Secondary | ICD-10-CM | POA: Diagnosis not present

## 2017-03-01 NOTE — Patient Instructions (Addendum)
Testing/Procedures: Your physician has requested that you have an echocardiogram. Echocardiography is a painless test that uses sound waves to create images of your heart. It provides your doctor with information about the size and shape of your heart and how well your heart's chambers and valves are working. This procedure takes approximately one hour. There are no restrictions for this procedure.  Your physician has requested that you have a stress echocardiogram. For further information please visit www.cardiosmart.org. Please follow instruction sheet as given.   Do not drink or eat foods with caffeine for 24 hours before the test. (Chocolate, coffee, tea, or energy drinks)  If you use an inhaler, bring it with you to the test.  Do not smoke for 4 hours before the test.  Wear comfortable shoes and clothing.  Follow-Up: Your physician recommends that you schedule a follow-up appointment as needed. We will call you with results and if needed schedule follow up at that time.   It was a pleasure seeing you today here in the office. Please do not hesitate to give us a call back if you have any further questions. [phone]  Keanthony Poole A. RN, BSN    Echocardiogram An echocardiogram, or echocardiography, uses sound waves (ultrasound) to produce an image of your heart. The echocardiogram is simple, painless, obtained within a short period of time, and offers valuable information to your health care provider. The images from an echocardiogram can provide information such as:  Evidence of coronary artery disease (CAD).  Heart size.  Heart muscle function.  Heart valve function.  Aneurysm detection.  Evidence of a past heart attack.  Fluid buildup around the heart.  Heart muscle thickening.  Assess heart valve function. Tell a health care provider about:  Any allergies you have.  All medicines you are taking, including vitamins, herbs, eye drops, creams, and over-the-counter  medicines.  Any problems you or family members have had with anesthetic medicines.  Any blood disorders you have.  Any surgeries you have had.  Any medical conditions you have.  Whether you are pregnant or may be pregnant. What happens before the procedure? No special preparation is needed. Eat and drink normally. What happens during the procedure?  In order to produce an image of your heart, gel will be applied to your chest and a wand-like tool (transducer) will be moved over your chest. The gel will help transmit the sound waves from the transducer. The sound waves will harmlessly bounce off your heart to allow the heart images to be captured in real-time motion. These images will then be recorded.  You may need an IV to receive a medicine that improves the quality of the pictures. What happens after the procedure? You may return to your normal schedule including diet, activities, and medicines, unless your health care provider tells you otherwise. This information is not intended to replace advice given to you by your health care provider. Make sure you discuss any questions you have with your health care provider. Document Released: 12/08/2000 Document Revised: 07/29/2016 Document Reviewed: 08/18/2013 Elsevier Interactive Patient Education  2017 Elsevier Inc.  Exercise Stress Echocardiogram An exercise stress echocardiogram is a test that checks how well your heart is working. For this test, you will walk on a treadmill to make your heart beat faster. This test uses sound waves (ultrasound) and a computer to make pictures (images) of your heart. These pictures will be taken before you exercise and after you exercise. What happens before the procedure?  Follow instructions   from your doctor about what you cannot eat or drink before the test.  Do not drink or eat anything that has caffeine in it. Stop having caffeine for 24 hours before the test.  Ask your doctor about changing or  stopping your normal medicines. This is important if you take diabetes medicines or blood thinners. Ask your doctor if you should take your medicines with water before the test.  If you use an inhaler, bring it to the test.  Do not use any products that have nicotine or tobacco in them, such as cigarettes and e-cigarettes. Stop using them for 4 hours before the test. If you need help quitting, ask your doctor.  Wear comfortable shoes and clothing. What happens during the procedure?  You will be hooked up to a TV screen. Your doctor will watch the screen to see how fast your heart beats during the test.  Before you exercise, a computer will make a picture of your heart. To do this:  A gel will be put on your chest.  A wand will be moved over the gel.  Sound waves from the wand will go to the computer to make the picture.  Your will start walking on a treadmill. The treadmill will start at a slow speed. It will get faster a little bit at a time. When you walk faster, your heart will beat faster.  The treadmill will be stopped when your heart is working hard.  You will lie down right away so another picture of your heart can be taken.  The test will take 30-60 minutes. What happens after the procedure?  Your heart rate and blood pressure will be watched after the test.  If your doctor says that you can, you may:  Eat what you usually eat.  Do your normal activities.  Take medicines like normal. Summary  An exercise stress echocardiogram is a test that checks how well your heart is working.  Follow instructions about what you cannot eat or drink before the test. Ask your doctor if you should take your normal medicines before the test.  Stop having caffeine for 24 hours before the test. Do not use anything with nicotine or tobacco in it for 4 hours before the test.  A computer will take a picture of your heart before you walk on a treadmill. It will take another picture when  you are done walking.  Your heart rate and blood pressure will be watched after the test. This information is not intended to replace advice given to you by your health care provider. Make sure you discuss any questions you have with your health care provider. Document Released: 10/08/2009 Document Revised: 09/03/2016 Document Reviewed: 09/03/2016 Elsevier Interactive Patient Education  2017 Elsevier Inc.  

## 2017-03-01 NOTE — Progress Notes (Signed)
Cardiology Office Note   Date:  03/01/2017   ID:  Jacqueline [name], DOB [birth year], MRN [id number]  Referring Doctor:  Eustaquio Boyden, MD   Cardiologist:   Almond Lint, MD   Reason for consultation:  Chief Complaint  Patient presents with  . other     Referred by Vernona Rieger for syncope. Recent ED visits. NO CP/sob. Reviewed meds with pt verbally.      History of Present Illness: Jacqueline [name] is a 31 y.o. female who presents for Episodes of passing out.  Symptoms started 3 weeks ago. 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 One episode started all of a sudden. She woke up feeling well. suddenly she started feeling tingling in her ears, muffled sound, vision closing in and she knew that if she did not lie down she would pass out. Also diaphoresis and tremors of the hand more on the right side. Supervisor was there with her and helped her lie flat. She was probably out for a few seconds or so. When she came to the room felt like it was spinning she felt fatigued and tired.She was told to rest the whole day.  2 weeks ago, she had a very similar episode. The last episode was probably a week ago. This time, she was already home here in the [place]. 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 That last episode happened after coming from an outing, on a 4 x 4. She was already at home and started having the same symptoms of muffled sounds, vision closing in, diaphoresis, tingling in the ears, and tremors. She decided to lie flat and symptoms went away after a few seconds.  She denies chest pain or shortness of breath with physical exertion. No palpitations she's had headaches not related to her episodes of passing out.   ROS:  Please see the history of present illness. Aside from mentioned under HPI, all other systems are reviewed and negative.     Past Medical History:  Diagnosis Date  . IBS (irritable bowel syndrome)    h/o this  . Smoker     Past Surgical  History:  Procedure Laterality Date  . CESAREAN SECTION  2012  . CHOLECYSTECTOMY  2005  . COLONOSCOPY  2006   WNL per pt  . ROTATOR CUFF REPAIR  2004   right  . SHOULDER SURGERY  2012   bone spurs; right     reports that she has been smoking Cigarettes.  She has been smoking about 0.50 packs per day. She has never used smokeless tobacco. She reports that she drinks alcohol. She reports that she does not use drugs.   family history includes CAD in her paternal grandfather; CAD (age of onset: 73) in her maternal grandfather; Cancer (age of onset: 6) in her paternal uncle; Cancer (age of onset: 46) in her paternal grandfather; Hyperlipidemia in her mother and sister; Hypertension in her mother and sister; Thyroid disease in her mother and sister.   Outpatient Medications Prior to Visit  Medication Sig Dispense Refill  . NUVARING 0.12-0.015 MG/24HR vaginal ring PLACE 1 RING VAGINALLY MONTHLY. LEAVE IN PLACE FOR 3 CONSECUTIVE WEEKS, THEN REMOVE FOR 1 WEEK.  6   No facility-administered medications prior to visit.      Allergies: Demerol [meperidine]    PHYSICAL EXAM: VS:  BP 128/84 (BP Location: Right Arm, Patient Position: Sitting, Cuff Size: Normal)   Pulse 70   Ht 5\' 3"  (1.6  m)   Wt 131 lb 8 oz (59.6 kg)   LMP 02/05/2017   BMI 23.29 kg/m  , Body mass index is 23.29 kg/m. Wt Readings from Last 3 Encounters:  03/01/17 131 lb 8 oz (59.6 kg)  02/21/17 134 lb (60.8 kg)  02/19/17 125 lb (56.7 kg)    Orthostatic VS for the past 24 hrs (Last 3 readings):  BP- Lying Pulse- Lying BP- Sitting Pulse- Sitting BP- Standing at 0 minutes Pulse- Standing at 0 minutes BP- Standing at 3 minutes Pulse- Standing at 3 minutes  03/01/17 1148 120/82 69 120/80 71 110/82 83 108/82 77    GENERAL:  well developed, well nourished, not in acute distress HEENT: normocephalic, pink conjunctivae, anicteric sclerae, no xanthelasma, normal dentition, oropharynx clear NECK:  no neck vein engorgement, JVP  normal, no hepatojugular reflux, carotid upstroke brisk and symmetric, no bruit, no thyromegaly, no lymphadenopathy LUNGS:  good respiratory effort, clear to auscultation bilaterally CV:  PMI not displaced, no thrills, no lifts, S1 and S2 within normal limits, no palpable S3 or S4, no murmurs, no rubs, no gallops ABD:  Soft, nontender, nondistended, normoactive bowel sounds, no abdominal aortic bruit, no hepatomegaly, no splenomegaly MS: nontender back, no kyphosis, no scoliosis, no joint deformities EXT:  2+ DP/PT pulses, no edema, no varicosities, no cyanosis, no clubbing SKIN: warm, nondiaphoretic, normal turgor, no ulcers NEUROPSYCH: alert, oriented to person, place, and time, sensory/motor grossly intact, normal mood, appropriate affect  Recent Labs: 09/21/2016: TSH 1.56 02/21/2017: ALT 22; BUN 12; Creatinine, Ser 1.04; Hemoglobin 13.0; Platelets 235.0; Potassium 3.6; Sodium 142   Lipid Panel    Component Value Date/Time   CHOL 175 09/21/2016 1550   TRIG 107.0 09/21/2016 1550   HDL 63.20 09/21/2016 1550   CHOLHDL 3 09/21/2016 1550   VLDL 21.4 09/21/2016 1550   LDLCALC 90 09/21/2016 1550     Other studies Reviewed:  EKG:  The ekg from 03/01/2017 was personally reviewed by me and it revealed sinus rhythm, PR is 106 ms. EKG similar from previous. She had an EKG 02/19/2017 that showed a normal PR interval.  Additional studies/ records that were reviewed personally reviewed by me today include: None available     ASSESSMENT AND PLAN: Loss of consciousness There was some orthostatic change in her vital signs when checked today. However, she was not symptomatic. Nevertheless, recommended to stay hydrated. Her loss of consciousness or syncope is not typical for arrhythmia. Also the context is not classic for orthostatic hypotension  --- the episodes do not happen after prolonged standing however we will recommend to rule out any cardiac etiology with an echocardiogram, long-term  monitor, and a stress echocardiogram. The monitor is ordered to detect any arrhythmia that may be related to her short PR interval. No delta wave noted however. We'll have recommend that she get evaluated for any seizure or neurologic disorder. There is similarity in her symptoms with a sister's who was diagnosed with migraine/seizure. There is history of premature CAD in the family    Current medicines are reviewed at length with the patient today.  The patient does not have concerns regarding medicines.  Labs/ tests ordered today include:  Orders Placed This Encounter  Procedures  . LONG TERM MONITOR (3-14 DAYS)  . EKG 12-Lead  . ECHOCARDIOGRAM COMPLETE  . ECHOCARDIOGRAM STRESS TEST    I had a lengthy and detailed discussion with the patient regarding diagnoses, prognosis, diagnostic options    Disposition:   FU with undersigned after tests  Thank you for this consultation. We will forwarding this consultation to referring physician.   I spent at least 80 minutes in the care of this patient today and more than 50% of the time was spent counseling the patient and coordinating care.    Signed, Almond LintAileen Ercel Normoyle, MD  03/01/2017 5:23 PM    [facility]  This note was generated in part with voice recognition software and I apologize for any typographical errors that were not detected and corrected.

## 2017-03-06 ENCOUNTER — Telehealth: Payer: Self-pay | Admitting: Cardiology

## 2017-03-06 NOTE — Telephone Encounter (Signed)
Received records request [employer], forwarded to [employer] for processing.  

## 2017-03-06 NOTE — Telephone Encounter (Signed)
Received records request [employer] FML[employer] Paperwork , [facility].

## 2017-03-12 NOTE — Telephone Encounter (Signed)
Patient received letter from [employer] to return release and $25 fee to office to be mailed.  Patient brought in forms sent to [employer] via fax and interoffice mail 03/12/17 [facility]

## 2017-03-15 DIAGNOSIS — R55 Syncope and collapse: Secondary | ICD-10-CM | POA: Diagnosis not present

## 2017-03-23 ENCOUNTER — Telehealth: Payer: Self-pay | Admitting: Cardiology

## 2017-03-23 NOTE — Telephone Encounter (Signed)
Received [employer] form, placed in nurses box.

## 2017-03-23 NOTE — Telephone Encounter (Signed)
FMLA/Disability forms received from [employer] 03/23/17 for Dr. Gearlean Alf review and signature. Placed in Dr. Gearlean Alf "In box" on her desk for her review.

## 2017-03-26 NOTE — Telephone Encounter (Signed)
Spoke w/ pt.  Advised her that Dr. Alvino Chapel is out of the office this week.  She states that she started the process of getting her disability paperwork at her last ov on 03/01/17. She reports that the delay has caused her to lose her job and she is extremely frustrated.

## 2017-03-26 NOTE — Telephone Encounter (Signed)
Patient calling to check on status of forms please call .  Patient needs forms complete asap.

## 2017-03-28 NOTE — Telephone Encounter (Signed)
Gave completed forms to [place] and she will process.

## 2017-03-28 NOTE — Telephone Encounter (Signed)
I filled this out over the weekend.

## 2017-03-28 NOTE — Telephone Encounter (Signed)
Forwarded completed forms to [employer]

## 2017-03-28 NOTE — Telephone Encounter (Signed)
Also, came to know about that paperwork only last week, not before that. Just to document: we told her that ideally she should not go back to work until we rule out anything that is significantly cardiac related and that she should also ff up with her PCP for possible neuro work up.The reasoning behind is that she works at [employer] site in [place] for 28 days straight and we were worried that since we do not know exactly what has been going on, it may be safer to do the tests first.  We asked her whether she thought it would be ok with her work and that we do not want her job to be in jeopardy. She said she thinks it shoud be ok.  We wrote her a work note re this.

## 2017-03-30 ENCOUNTER — Ambulatory Visit (INDEPENDENT_AMBULATORY_CARE_PROVIDER_SITE_OTHER): Payer: 59

## 2017-03-30 ENCOUNTER — Other Ambulatory Visit: Payer: Self-pay

## 2017-03-30 DIAGNOSIS — R55 Syncope and collapse: Secondary | ICD-10-CM

## 2017-03-30 LAB — ECHOCARDIOGRAM STRESS TEST
CSEPEW: 15.6 METS
CSEPHR: 101 %
CSEPPHR: 190 {beats}/min
Exercise duration (min): 13 min
Exercise duration (sec): 11 s
MPHR: 187 {beats}/min
Rest HR: 90 {beats}/min

## 2017-05-03 ENCOUNTER — Telehealth: Payer: Self-pay | Admitting: *Deleted

## 2017-05-03 NOTE — Telephone Encounter (Signed)
Form received is a request for Medical Information. Will review with Dr. Alvino ChapelIngal.

## 2017-05-03 NOTE — Telephone Encounter (Signed)
Left voicemail message that form was faxed to number circled on form and to call back if she has any questions. Original form placed in Faxed bin on Pamela's desk.

## 2017-05-03 NOTE — Telephone Encounter (Signed)
Patient came into office and drop off a medical form to return to work. She states she needs this form completed by Dr. Alvino ChapelIngal and faxed back to her employer before Monday. Patient has to be back to work on Monday . She is leaving to go out of the country for work on that day. Patient is requesting you fax the form. The fax number if on the form. Patient states she will call tomorrow to get an update. Thank you

## 2017-05-09 ENCOUNTER — Telehealth: Payer: Self-pay | Admitting: Family Medicine

## 2017-05-09 DIAGNOSIS — R55 Syncope and collapse: Secondary | ICD-10-CM

## 2017-05-09 NOTE — Telephone Encounter (Signed)
Referral placed. Any recurrent syncope?

## 2017-05-09 NOTE — Telephone Encounter (Signed)
Pt called she saw kate end of feb for syncope.  She went to cardiology they wanted her to go to neurology.  She needs referral.  She doesn't care [place] [facility] [facility] available

## 2017-05-11 NOTE — Telephone Encounter (Signed)
Jacqueline BallRobin spoke with patient and [facility] and [facility] is [facility]. MK

## 2017-05-17 ENCOUNTER — Encounter: Payer: Self-pay | Admitting: Neurology

## 2017-05-17 ENCOUNTER — Ambulatory Visit (INDEPENDENT_AMBULATORY_CARE_PROVIDER_SITE_OTHER): Payer: 59 | Admitting: Neurology

## 2017-05-17 VITALS — BP 144/95 | HR 79 | Resp 16 | Ht 63.0 in | Wt 134.0 lb

## 2017-05-17 DIAGNOSIS — R55 Syncope and collapse: Secondary | ICD-10-CM

## 2017-05-17 MED ORDER — FLUDROCORTISONE ACETATE 0.1 MG PO TABS: 0.1000 mg | ORAL_TABLET | Freq: Every day | ORAL | 3 refills | Status: DC

## 2017-05-17 NOTE — Progress Notes (Signed)
[facility]  PATIENT: Jacqueline [name] DOB: 04-Feb-1986  REFERRING DOCTOR OR PCP:  Jacqueline [name] SOURCE: Patients, notes from PCP, cardiology tests, laboratory tests.  _________________________________   HISTORICAL  CHIEF COMPLAINT:  Chief Complaint  Patient presents with  . Loss of Consciousness    Jacqueline [name] is here with her husband Jacqueline [name] for eval of one syncopal episode, several near syncopal episodes.  Sts. at the end of Feb. 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  With the other episodes, as soon as she experienced ringing in her ears, feeling sweaty, she was able to lie down and avoid syncope. She felt better w/i about 15 min. and was able to continue with her day.  Sts. she has had a negative cardiac workup.  Employer is requiring a  . Near Syncope    neurology eval prior to her return to work. Pt. sts. 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  Although she takes supplements, she loses 15-20 lbs each month that she is working./fim    HISTORY OF PRESENT ILLNESS:  I had the pleasure seeing you patient, Jacqueline [name], at [facility] for neurologic consultation regarding her episode of syncope and several episodes of presyncope.  Her only episode of syncope occurred in February 2018.    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   She felt baseline before her episode of syncope.   She was talking to her boss when she started having a ringing like sensation in her ears with tunnel vision and sweating.    Then she lost consciousness.    She only lost consciousness for a couple seconds.  She went to the medical station.  She had BP and glucose tested and both were fine.    She felt fine afterward and completed the rest of her shift that day and the rest of her month on the oil rig.   She had one other episode that month while  leaving a hot shower when she had a milder episode.   Another episode occurred while in a hotel and a third mild episode while outside, a day or 2 after she returned from [place] back to [place].   All 3 episodes quickly improved after she laid down and took off some clothes (she felt very sweaty).      She did not feel dehydrated at any time.    She does not get any convulsions and no staring spells.      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    On the oil rig, she eats very poorly and loses weight (15-20 pounds) each month as she usually just eats rice and ketchup on the rig.   [place] and she does not feel the meat is safe as refrigeration is iffy.  She is only allowed to take 30 kg with her for the entire month so cannot take much in the way of personal food though she does take some candy bars with her.  In March and April, she had a cardiac evaluation.   She had an EKG, stress Echo and rhythm monitor tests and these were all normal.  She has not had an EEG.      Pregnancy test, CMP and CBC  were normal in February.    REVIEW OF SYSTEMS: Constitutional: No fevers, chills, sweats, or change in appetite Eyes: No visual changes, double vision, eye pain Ear, nose and throat: No hearing loss, ear pain, nasal congestion, sore throat Cardiovascular: No chest pain, palpitations Respiratory: No shortness of breath at rest or with exertion.   No wheezes GastrointestinaI: No nausea, vomiting, diarrhea, abdominal pain, fecal incontinence Genitourinary: No dysuria, urinary retention or frequency.  No nocturia. Musculoskeletal: No neck pain, back pain Integumentary: No rash, pruritus, skin lesions Neurological: as above Psychiatric: No depression at this time.  No anxiety Endocrine: No palpitations, diaphoresis, change in appetite, change in weigh or increased thirst Hematologic/Lymphatic: No anemia, purpura, petechiae. Allergic/Immunologic: No  itchy/runny eyes, nasal congestion, recent allergic reactions, rashes  ALLERGIES: Allergies  Allergen Reactions  . Demerol [Meperidine] Nausea And Vomiting    HOME MEDICATIONS:  Current Outpatient Prescriptions:  .  NUVARING 0.12-0.015 MG/24HR vaginal ring, PLACE 1 RING VAGINALLY MONTHLY. LEAVE IN PLACE FOR 3 CONSECUTIVE WEEKS, THEN REMOVE FOR 1 WEEK., Disp: , Rfl: 6  PAST MEDICAL HISTORY: Past Medical History:  Diagnosis Date  . IBS (irritable bowel syndrome)    h/o this  . Smoker     PAST SURGICAL HISTORY: Past Surgical History:  Procedure Laterality Date  . CESAREAN SECTION  2012  . CHOLECYSTECTOMY  2005  . COLONOSCOPY  2006   WNL per pt  . ROTATOR CUFF REPAIR  2004   right  . SHOULDER SURGERY  2012   bone spurs; right    FAMILY HISTORY: Family History  Problem Relation Age of Onset  . Cancer Paternal Uncle [id number]       colon  . Cancer Paternal Grandfather 7675       colon  . CAD Paternal Grandfather   . CAD Maternal Grandfather 40       MI  . Hypertension Mother   . Hyperlipidemia Mother   . Thyroid disease Mother   . Hyperlipidemia Sister   . Hypertension Sister   . Thyroid disease Sister   . Stroke Neg Hx   . Diabetes Neg Hx     SOCIAL HISTORY:  Social History   Social History  . Marital status: Single    Spouse name: N/A  . Number of children: N/A  . Years of education: N/A   Occupational History  . Not on file.   Social History Main Topics  . Smoking status: Current Every Day Smoker    Packs/day: 0.50    Types: Cigarettes  . Smokeless tobacco: Never Used  . Alcohol use Yes     Comment: very rare (6 pack/year)  . Drug use: No  . Sexual activity: Not on file   Other Topics Concern  . Not on file   Social History Narrative   Caffeine: 1-2 soft drinks/day   Lives with husband and 1 child, 2 dogs.   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   General: The patient is well-developed and well-nourished and in no acute distress  Eyes:  Funduscopic exam shows normal optic discs and retinal vessels.  Neck: The neck is  supple, no carotid bruits are noted.  The neck is nontender.  Cardiovascular: The heart has a regular rate and rhythm with a normal S1 and S2. There were no murmurs, gallops or rubs. Lungs are clear to auscultation.  Skin: Extremities are without significant edema.  Musculoskeletal:  Back is nontender  Neurologic Exam  Mental status: The patient is alert and oriented x 3 at the time of the examination. The patient has apparent normal recent and remote memory, with an apparently normal attention span and concentration ability.   Speech is normal.  Cranial nerves: Extraocular movements are full. Pupils are equal, round, and reactive to light and accomodation.  Visual fields are full.  Facial symmetry is present. There is good facial sensation to soft touch bilaterally.Facial strength is normal.  Trapezius and sternocleidomastoid strength is normal. No dysarthria is noted.  The tongue is midline, and the patient has symmetric elevation of the soft palate. No obvious hearing deficits are noted.  Motor:  Muscle bulk is normal.   Tone is normal. Strength is  5 / 5 in all 4 extremities.   Sensory: Sensory testing is intact to pinprick, soft touch and vibration sensation in all 4 extremities.  Coordination: Cerebellar testing reveals good finger-nose-finger and heel-to-shin bilaterally.  Gait and station: Station is normal.   Gait is normal. Tandem gait is normal. Romberg is negative.   Reflexes: Deep tendon reflexes are symmetric and normal bilaterally.   Plantar responses are flexor.    DIAGNOSTIC DATA (LABS, IMAGING, TESTING) - I  reviewed patient records, labs, notes, testing and imaging myself where available.  Lab Results  Component Value Date   WBC 11.2 (H) 02/21/2017   HGB 13.0 02/21/2017   HCT 38.5 02/21/2017   MCV 93.0 02/21/2017   PLT 235.0 02/21/2017      Component Value Date/Time   NA 142 02/21/2017 1155   K 3.6 02/21/2017 1155   CL 110 02/21/2017 1155   CO2 26 02/21/2017 1155   GLUCOSE 82 02/21/2017 1155   BUN 12 02/21/2017 1155   CREATININE 1.04 02/21/2017 1155   CALCIUM 8.8 02/21/2017 1155   PROT 6.3 02/21/2017 1155   ALBUMIN 4.0 02/21/2017 1155   AST 20 02/21/2017 1155   ALT 22 02/21/2017 1155   ALKPHOS 36 (L) 02/21/2017 1155   BILITOT 0.2 02/21/2017 1155   Lab Results  Component Value Date   CHOL 175 09/21/2016   HDL 63.20 09/21/2016   LDLCALC 90 09/21/2016   TRIG 107.0 09/21/2016   CHOLHDL 3 09/21/2016   No results found for: HGBA1C No results found for: VITAMINB12 Lab Results  Component Value Date   TSH 1.56 09/21/2016       ASSESSMENT AND PLAN  Syncope, unspecified syncope type - Plan: EEG adult  Near syncope   In summary, Ms. Primm is a 31 year old woman who had a single episode of syncope and several episodes of presyncope, all with rapid recovery after she laid down. No seizure activity was observed. Her episodes are much more consistent with syncope than with seizure. However, due to her medical isolation half the year, I feel we need to check an EEG to make sure that there is not any evidence of seizure activity.   Some of the episodes could've been orthostatic in nature, though the initial episode occurred while she was sitting. I'll have her try fludrocortisone 0.1 mg to see if that can help her prevent episodes. She is advised to try to eat more when she is awake.  If she is allowed to take some food with her, that might help (because she is taken by helicopter the limit baggage to 30 kg).   If the EEG is normal, I feel she could return to her job.  We will call  her with the results of the EEG and fill out paperwork based on results.  Thank you for asking me to see Mrs. Szwed. Please let me know if I can be of further assistance with her or other patients in the future.   Richard A. Epimenio Foot, MD, PhD 05/17/2017, 2:52 PM Certified in Neurology, Clinical Neurophysiology, Sleep Medicine, Pain Medicine and Neuroimaging  [facility] [address] [place], [place] [address] [phone]

## 2017-05-18 ENCOUNTER — Other Ambulatory Visit (INDEPENDENT_AMBULATORY_CARE_PROVIDER_SITE_OTHER): Payer: 59

## 2017-05-18 ENCOUNTER — Telehealth: Payer: Self-pay | Admitting: Neurology

## 2017-05-18 ENCOUNTER — Other Ambulatory Visit: Payer: Self-pay | Admitting: Neurology

## 2017-05-18 DIAGNOSIS — R55 Syncope and collapse: Secondary | ICD-10-CM

## 2017-05-18 NOTE — Telephone Encounter (Signed)
Her EEG was normal while awake and asleep.  I tried to call her with the results and also to find out additional information so I can complete her paperwork   We will try to call her again early next week.

## 2017-05-22 NOTE — Telephone Encounter (Signed)
I called and left a message that the EEG was normal .   I wanted to find out what paperwork she needed so that she could return to work. If she has been out of work for a while. I need to have the dates.   Faith, please see if he can return to work tomorrow. [facility] in the top section of the box.   If you could get the date needed for me to fill out I can complete the paperwork on Thursday.

## 2017-05-23 NOTE — Telephone Encounter (Signed)
I have spoken with Jacqueline [name] this morning--she sts. is doing well.  No further episodes of syncope/presyncope.  She sts. can put today's date as a return to work date.  Sts. she still has to be cleared by her company physician as well.  She requested that in addition to faxing forms [facility] to [facility], that we also fax them to her company office, [name], at fax# [phone]/fim

## 2017-05-24 ENCOUNTER — Telehealth: Payer: Self-pay | Admitting: *Deleted

## 2017-05-24 NOTE — Telephone Encounter (Signed)
FMLA forms completed, faxed to [facility] and [employer] per pt's request.  Copy sent to be scanned into emr/fim

## 2017-06-06 ENCOUNTER — Encounter: Payer: Self-pay | Admitting: Family Medicine

## 2017-06-06 ENCOUNTER — Ambulatory Visit (INDEPENDENT_AMBULATORY_CARE_PROVIDER_SITE_OTHER): Payer: 59 | Admitting: Family Medicine

## 2017-06-06 VITALS — BP 112/70 | HR 88 | Temp 98.5°F | Wt 138.5 lb

## 2017-06-06 DIAGNOSIS — R21 Rash and other nonspecific skin eruption: Secondary | ICD-10-CM

## 2017-06-06 MED ORDER — TRIAMCINOLONE ACETONIDE 0.1 % EX CREA: 1.0000 "application " | TOPICAL_CREAM | Freq: Two times a day (BID) | CUTANEOUS | 0 refills | Status: DC

## 2017-06-06 NOTE — Assessment & Plan Note (Addendum)
Isolated to 3 fingers of right hand. 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 Our KOH has expired. Skin scrapings sent for lab KOH slide. In interim, treat with TCI cream, eczema supportive care. Update if worsening on TCI cream. Pt agrees with plan.   ADDENDUM ==> unable to order plain KOH prep without fungal culture. Will request expedited KOH to do skin KOH prep in office tomorrow. Spoke with Rodney Boozeasha and Carollee HerterShannon.

## 2017-06-06 NOTE — Progress Notes (Addendum)
BP 112/70   Pulse 88   Temp 98.5 F (36.9 C) (Oral)   Wt 138 lb 8 oz (62.8 kg)   LMP 05/08/2017   BMI 24.53 kg/m    CC: finger blisters Subjective:    Patient ID: Jacqueline [name] Yehle, female    DOB: [birth year], 31 y.o.   MRN: [id number]  HPI: Jacqueline [name] Harig is a 31 y.o. female presenting on 06/06/2017 for blisters on fingers (comes and goes, started abouit 5 years)   Over last 5 yrs noticing blistering of fingers of right hand, worse recently. Started R index finger, now spread to middle finger and 5th finger. Very itchy. Blisters drain clear fluid.  Treats with bandaids, neosporin. Has also tried cortisone 10, benadryl cream. Pt also tried tinactin without improvement.  Thinks heat triggers blistering.  No other skin rash, oral lesions, fevers/chills, nausea.  No new lotions, detergents, soaps or shampoos. Initially more prevalent in the summer, this year also occurred during winter month (while in [place]).  She does regularly use aveeno moisturizing cream.  She does have new blistering rash on right 3rd toe as well, mildly itchy.   Syncope of unclear cause - remained out of work prolonged period earlier this year. S/p cardiology and neurology evaluation. Now back at work. Neuro suggested PRN fludrocortisone 0.1mg .   Relevant past medical, surgical, family and social history reviewed and updated as indicated. Interim medical history since our last visit reviewed. Allergies and medications reviewed and updated. Outpatient Medications Prior to Visit  Medication Sig Dispense Refill  . fludrocortisone (FLORINEF) 0.1 MG tablet Take 1 tablet (0.1 mg total) by mouth daily. 90 tablet 3  . NUVARING 0.12-0.015 MG/24HR vaginal ring PLACE 1 RING VAGINALLY MONTHLY. LEAVE IN PLACE FOR 3 CONSECUTIVE WEEKS, THEN REMOVE FOR 1 WEEK.  6   No facility-administered medications prior to visit.      Per HPI unless specifically indicated in ROS section below Review of Systems     Objective:    BP  112/70   Pulse 88   Temp 98.5 F (36.9 C) (Oral)   Wt 138 lb 8 oz (62.8 kg)   LMP 05/08/2017   BMI 24.53 kg/m   Wt Readings from Last 3 Encounters:  06/06/17 138 lb 8 oz (62.8 kg)  05/17/17 134 lb (60.8 kg)  03/01/17 131 lb 8 oz (59.6 kg)    Physical Exam  Constitutional: She appears well-developed and well-nourished. No distress.  Musculoskeletal: She exhibits no edema.  Skin: Skin is warm and dry. Rash noted. No erythema.  Drying scaly rash posterior and lateral 2nd, 3rd, 5th digits isolated to right hand. Initial vesicles all dry.  She does also have subcutaneous blisters dorsal distal 3rd toe of right foot No other skin rash  Nursing note and vitals reviewed.      Assessment & Plan:   Problem List Items Addressed This Visit    Skin rash - Primary    Isolated to 3 fingers of right hand. 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 Our KOH has expired. Skin scrapings sent for lab KOH slide. In interim, treat with TCI cream, eczema supportive care. Update if worsening on TCI cream. Pt agrees with plan.   ADDENDUM ==> unable to order plain KOH prep without fungal culture. Will request expedited KOH to do skin KOH prep in office tomorrow. Spoke with Rodney Boozeasha and Carollee HerterShannon.         ADDENDUM 06/18/2017 ==> KOH prep arrived today - likely too late  to do skin scrapings. I did anyways, I didn't see obvious hyphae/yeast.   Follow up plan: Return if symptoms worsen or fail to improve.  Eustaquio Boyden, MD

## 2017-06-06 NOTE — Patient Instructions (Addendum)
I wonder about dishydrotic eczema - type of hand eczema. Treat with continued regular moisturizing to skin, avoid too hot water.  Add triamcinolone cream as needed, no more than 2 weeks at time, at first signs of return of rash.  Watch for any other triggers and avoid if found.   Hand Dermatitis Hand dermatitis is a skin condition that causes small, itchy, raised dots or fluid-filled blisters to form over the palms of the hands. This condition may also be called hand eczema. What are the causes? The cause of this condition is not known. What increases the risk? This condition is more likely to develop in people who have a history of allergies, such as:  Hay fever.  Allergic asthma.  An allergy to latex.  Chemical exposure, injuries, and environmental irritants can make hand dermatitis worse. Washing your hands too often can remove natural oils, which can dry out the skin and contribute to outbreaks of this condition. What are the signs or symptoms? The most common symptom of this condition is intense itchiness. Cracks or grooves (fissures) on the fingers can also develop. Affected areas can be painful, especially areas where large blisters have formed. How is this diagnosed? This condition is diagnosed with a medical history and physical exam. How is this treated? This condition is treated with medicines, including:  Steroid creams and ointments.  Oral steroid medicines.  Antibiotic medicines. These are prescribed if you have an infection.  Antihistamine medicines. These help to reduce itchiness.  Follow these instructions at home:  Take or apply over-the-counter and prescription medicines only as told by your health care provider.  If you were prescribed an antibiotic medicine, use it as told by your health care provider. Do not stop using the antibiotic even if you start to feel better.  Avoid washing your hands more often than necessary.  Avoid using harsh chemicals on  your hands.  Wear protective gloves when you handle products that can irritate your skin.  Keep all follow-up visits as told by your health care provider. This is important. Contact a health care provider if:  Your rash does not improve during the first week of treatment.  Your rash is red or tender.  Your rash has pus coming from it.  Your rash spreads. This information is not intended to replace advice given to you by your health care provider. Make sure you discuss any questions you have with your health care provider. Document Released: 12/11/2005 Document Revised: 05/18/2016 Document Reviewed: 06/25/2015 Elsevier Interactive Patient Education  [employer]2018 [employer]

## 2017-06-20 ENCOUNTER — Ambulatory Visit (INDEPENDENT_AMBULATORY_CARE_PROVIDER_SITE_OTHER): Payer: 59 | Admitting: Family Medicine

## 2017-06-20 ENCOUNTER — Encounter: Payer: Self-pay | Admitting: Family Medicine

## 2017-06-20 VITALS — BP 112/78 | HR 92 | Temp 99.1°F | Wt 138.0 lb

## 2017-06-20 DIAGNOSIS — R059 Cough, unspecified: Secondary | ICD-10-CM

## 2017-06-20 DIAGNOSIS — R05 Cough: Secondary | ICD-10-CM

## 2017-06-20 DIAGNOSIS — J181 Lobar pneumonia, unspecified organism: Secondary | ICD-10-CM

## 2017-06-20 DIAGNOSIS — R21 Rash and other nonspecific skin eruption: Secondary | ICD-10-CM

## 2017-06-20 LAB — POCT SKIN KOH: SKIN KOH, POC: NEGATIVE

## 2017-06-20 MED ORDER — AZITHROMYCIN 250 MG PO TABS: ORAL_TABLET | ORAL | 0 refills | Status: DC

## 2017-06-20 NOTE — Addendum Note (Signed)
Addended by: Eustaquio BoydenGUTIERREZ, Keiondre Colee on: 06/20/2017 05:58 PM   Modules accepted: Orders

## 2017-06-20 NOTE — Patient Instructions (Signed)
I do think you have bronchitis - treat with zpack antibiotic sent to pharmacy. For fever - continue ibuprofen 600mg  three times daily with food as needed, alternating every 4 hours with tylenol 1000mg  three times daily as needed.  Push fluids and rest.  Let us know if ongoing fever past 5 days, worsening productive cough.

## 2017-06-20 NOTE — Assessment & Plan Note (Signed)
Rpt KOH done today.

## 2017-06-20 NOTE — Assessment & Plan Note (Signed)
Fever with productive cough over last 3-4 days. No obvious pneumonia heard on lung exam however I would like to cover for CAP vs atypical bronchitis with zpack antibiotic sent to pharmacy. Discussed anticipated course of resolution. Pt agrees with plan.

## 2017-06-20 NOTE — Progress Notes (Addendum)
BP 112/78   Pulse 92   Temp 99.1 F (37.3 C) (Oral)   Wt 138 lb (62.6 kg)   LMP 06/14/2017   SpO2 95%   BMI 24.45 kg/m    CC: fever Subjective:    Patient ID: Jacqueline [name], female    DOB: 28-Jan-1986, 31 y.o.   MRN: [id number]  HPI: Jacqueline [name] is a 31 y.o. female presenting on 06/20/2017 for Fever (101.3 after motrin)   3-4d h/o fever to 101.3 associated with body aches and yesterday with productive cough of green sputum. Tmax 101.3 after motrin/tylenol. Last tylenol/ibuprofen at 9:30am.   Denies headaches, head congestion, ear or tooth pain, ST, dyspnea or wheezing. No abd pain, urinary changes.  Daughter recently had sinus infection treated with abx 2 wks ago.  Current smoker No h/o asthma.   Was camping this weekend.  No recent tick bites.  Finger rash R hand without improvement after steroid cream.   Relevant past medical, surgical, family and social history reviewed and updated as indicated. Interim medical history since our last visit reviewed. Allergies and medications reviewed and updated. Outpatient Medications Prior to Visit  Medication Sig Dispense Refill  . fludrocortisone (FLORINEF) 0.1 MG tablet Take 1 tablet (0.1 mg total) by mouth daily. 90 tablet 3  . NUVARING 0.12-0.015 MG/24HR vaginal ring PLACE 1 RING VAGINALLY MONTHLY. LEAVE IN PLACE FOR 3 CONSECUTIVE WEEKS, THEN REMOVE FOR 1 WEEK.  6  . triamcinolone cream (KENALOG) 0.1 % Apply 1 application topically 2 (two) times daily. Apply to AA. 60 g 0   No facility-administered medications prior to visit.      Per HPI unless specifically indicated in ROS section below Review of Systems     Objective:    BP 112/78   Pulse 92   Temp 99.1 F (37.3 C) (Oral)   Wt 138 lb (62.6 kg)   LMP 06/14/2017   SpO2 95%   BMI 24.45 kg/m   Wt Readings from Last 3 Encounters:  06/20/17 138 lb (62.6 kg)  06/06/17 138 lb 8 oz (62.8 kg)  05/17/17 134 lb (60.8 kg)    Physical Exam  Constitutional: She appears  well-developed and well-nourished. No distress.  Tired appearing but nontoxic  HENT:  Head: Normocephalic and atraumatic.  Right Ear: Hearing, tympanic membrane, external ear and ear canal normal.  Left Ear: Hearing, tympanic membrane, external ear and ear canal normal.  Nose: No mucosal edema or rhinorrhea. Right sinus exhibits no maxillary sinus tenderness and no frontal sinus tenderness. Left sinus exhibits no maxillary sinus tenderness and no frontal sinus tenderness.  Mouth/Throat: Uvula is midline, oropharynx is clear and moist and mucous membranes are normal. No oropharyngeal exudate, posterior oropharyngeal edema, posterior oropharyngeal erythema or tonsillar abscesses.  Eyes: Conjunctivae and EOM are normal. Pupils are equal, round, and reactive to light. No scleral icterus.  Neck: Normal range of motion. Neck supple.  Cardiovascular: Normal rate, regular rhythm, normal heart sounds and intact distal pulses.   No murmur heard. Pulmonary/Chest: Effort normal and breath sounds normal. No respiratory distress. She has no wheezes. She has no rales.  Musculoskeletal: She exhibits no edema.  Lymphadenopathy:    She has no cervical adenopathy.  Skin: Skin is warm and dry. No rash noted. No erythema.  Drying scaly rash posterior and lateral 2nd, 3rd, 5th digits isolated to right hand. Initial vesicles all dry.   Nursing note and vitals reviewed.  Results for orders placed or performed in visit on 06/20/17  POCT  Skin KOH  Result Value Ref Range   Skin KOH, POC Negative Negative       Assessment & Plan:   Problem List Items Addressed This Visit    Cough - Primary    Fever with productive cough over last 3-4 days. No obvious pneumonia heard on lung exam however I would like to cover for CAP vs atypical bronchitis with zpack antibiotic sent to pharmacy. Discussed anticipated course of resolution. Pt agrees with plan.       Skin rash    Rpt KOH done today.       Relevant Orders    POCT Skin KOH (Completed)       Follow up plan: Return if symptoms worsen or fail to improve.  Eustaquio BoydenJavier Lorraina Spring, MD

## 2017-08-13 ENCOUNTER — Ambulatory Visit: Payer: 59 | Admitting: Family Medicine

## 2017-08-13 ENCOUNTER — Ambulatory Visit (INDEPENDENT_AMBULATORY_CARE_PROVIDER_SITE_OTHER)
Admission: RE | Admit: 2017-08-13 | Discharge: 2017-08-13 | Disposition: A | Discharge status: Home / Self Care | Payer: 59 | Source: Ambulatory Visit | Attending: Family Medicine | Admitting: Family Medicine

## 2017-08-13 ENCOUNTER — Ambulatory Visit (INDEPENDENT_AMBULATORY_CARE_PROVIDER_SITE_OTHER): Payer: 59 | Admitting: Family Medicine

## 2017-08-13 ENCOUNTER — Encounter: Payer: Self-pay | Admitting: Family Medicine

## 2017-08-13 VITALS — BP 114/70 | HR 77 | Temp 98.4°F | Wt 132.5 lb

## 2017-08-13 DIAGNOSIS — S99911A Unspecified injury of right ankle, initial encounter: Secondary | ICD-10-CM | POA: Insufficient documentation

## 2017-08-13 NOTE — Assessment & Plan Note (Signed)
Concern for lateral ankle fracture - check xrays today - clear on my read. Will place in ASO brace and conservative treatment recommended as per instructions. RTC 1 wk f/u visit.

## 2017-08-13 NOTE — Patient Instructions (Signed)
Xray looking ok - we will call you if any change in plan based on radiologist read.  For now, use ASO brace provided today, elevate leg, ice leg, may use anti inflammatory over the counter like ibuprofen or aleve as needed.  Return in 1 week for recheck.
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
## 2017-08-20 ENCOUNTER — Encounter: Payer: Self-pay | Admitting: Family Medicine

## 2017-08-20 ENCOUNTER — Ambulatory Visit (INDEPENDENT_AMBULATORY_CARE_PROVIDER_SITE_OTHER): Payer: 59 | Admitting: Family Medicine

## 2017-08-20 VITALS — BP 118/72 | HR 92 | Temp 98.7°F | Wt 133.4 lb

## 2017-08-20 DIAGNOSIS — S99911D Unspecified injury of right ankle, subsequent encounter: Secondary | ICD-10-CM | POA: Diagnosis not present

## 2017-08-20 NOTE — Patient Instructions (Signed)
I think you did have a bad ankle sprain.  Continue ASO brace for full 4-6 weeks as it can take this long to heal. If after this, ongoing pain, or any worsening prior, let us know.  Start alphabet range of motion exercises when able to without pain. After that, may do stretching exercises provided today.

## 2017-08-20 NOTE — Progress Notes (Signed)
   BP 118/72 (BP Location: Left Arm, Patient Position: Sitting, Cuff Size: Normal)   Pulse 92   Temp 98.7 F (37.1 C) (Oral)   Wt 133 lb 6.4 oz (60.5 kg)   LMP 07/23/2017 (Approximate)   SpO2 97%   BMI 23.63 kg/m    CC: 1 wk R ankle f/u visit Subjective:    Patient ID: Jacqueline [name], female    DOB: [birth year], 31 y.o.   MRN: [id number]  HPI: Jacqueline [name] is a 31 y.o. female presenting on 08/20/2017 for 1 wk f/u right ankle injury (Reports ankle is better. Swelling is down)   See prior note for details.  Seen here 1 wk ago with R ankle injury thought bad lateral ankle sprain with reassuring xrays. Placed in ASO brace and I asked her to f/u today. Swelling markedly improved, ongoing soreness lateral foot and ankle.   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  Relevant past medical, surgical, family and social history reviewed and updated as indicated. Interim medical history since our last visit reviewed. Allergies and medications reviewed and updated. Outpatient Medications Prior to Visit  Medication Sig Dispense Refill  . fludrocortisone (FLORINEF) 0.1 MG tablet Take 1 tablet (0.1 mg total) by mouth daily. 90 tablet 3  . NUVARING 0.12-0.015 MG/24HR vaginal ring PLACE 1 RING VAGINALLY MONTHLY. LEAVE IN PLACE FOR 3 CONSECUTIVE WEEKS, THEN REMOVE FOR 1 WEEK.  6  . triamcinolone cream (KENALOG) 0.1 % Apply 1 application topically 2 (two) times daily. Apply to AA. 60 g 0   No facility-administered medications prior to visit.      Per HPI unless specifically indicated in ROS section below Review of Systems     Objective:    BP 118/72 (BP Location: Left Arm, Patient Position: Sitting, Cuff Size: Normal)   Pulse 92   Temp 98.7 F (37.1 C) (Oral)   Wt 133 lb 6.4 oz (60.5 kg)   LMP 07/23/2017 (Approximate)   SpO2 97%   BMI 23.63 kg/m   Wt Readings from Last 3 Encounters:  08/20/17 133 lb 6.4 oz (60.5 kg)  08/13/17 132 lb 8 oz (60.1 kg)  06/20/17 138 lb (62.6  kg)    Physical Exam  Constitutional: She appears well-developed and well-nourished. No distress.  Musculoskeletal: She exhibits no edema.  FROM at ankle Tender to palpation lateral ankle and cuboid No ligament laxity No pain at base of 5th MT  Skin: Skin is warm and dry. Bruising (lower lateral foot) noted. No rash noted.  Nursing note and vitals reviewed.      Assessment & Plan:   Problem List Items Addressed This Visit    Injury of right ankle - Primary    Again reviewed xrays from last week with patient.  Anticipate ongoing ATFL sprain that is healing.  Supportive care reviewed with patient.           Follow up plan: No Follow-up on file.  Eustaquio Boyden, MD

## 2017-08-20 NOTE — Assessment & Plan Note (Signed)
Again reviewed xrays from last week with patient.  Anticipate ongoing ATFL sprain that is healing.  Supportive care reviewed with patient.

## 2017-09-18 ENCOUNTER — Telehealth: Payer: Self-pay

## 2017-09-18 NOTE — Telephone Encounter (Signed)
Recommend she see sports med - Dr Patsy Lager is at our office if she'd like. May call to schedule appt for next week.

## 2017-09-18 NOTE — Telephone Encounter (Signed)
Feliz Beam pts husband left v/m (DPR signed to speak with Johnathan pts husband) pt was seen on 08/20/17 for foot pain and injury;pt is no better. 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

## 2017-09-19 NOTE — Telephone Encounter (Signed)
Spoke to Pt's husband. Pt travels for work and will be home tomorrow until the 8th. Wanted to see about working her in with Dr Patsy Lager if possible.

## 2017-09-19 NOTE — Telephone Encounter (Signed)
Can you make her an appointment with me on Monday?

## 2017-09-20 NOTE — Telephone Encounter (Signed)
I spoke to patient's husband.  Dr.Copland's schedule was full on Monday, so patient will come in on 09/26/17.

## 2017-09-26 ENCOUNTER — Ambulatory Visit (INDEPENDENT_AMBULATORY_CARE_PROVIDER_SITE_OTHER): Payer: 59 | Admitting: Family Medicine

## 2017-09-26 ENCOUNTER — Encounter: Payer: Self-pay | Admitting: Family Medicine

## 2017-09-26 ENCOUNTER — Ambulatory Visit (INDEPENDENT_AMBULATORY_CARE_PROVIDER_SITE_OTHER)
Admission: RE | Admit: 2017-09-26 | Discharge: 2017-09-26 | Disposition: A | Discharge status: Home / Self Care | Payer: Self-pay | Source: Ambulatory Visit | Attending: Family Medicine | Admitting: Family Medicine

## 2017-09-26 VITALS — BP 110/70 | HR 74 | Temp 98.5°F | Ht 63.0 in | Wt 132.8 lb

## 2017-09-26 DIAGNOSIS — M79671 Pain in right foot: Secondary | ICD-10-CM | POA: Diagnosis not present

## 2017-09-26 DIAGNOSIS — M25571 Pain in right ankle and joints of right foot: Secondary | ICD-10-CM

## 2017-09-26 DIAGNOSIS — S93491A Sprain of other ligament of right ankle, initial encounter: Secondary | ICD-10-CM

## 2017-09-26 DIAGNOSIS — S93431A Sprain of tibiofibular ligament of right ankle, initial encounter: Secondary | ICD-10-CM | POA: Diagnosis not present
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
## 2017-11-13 ENCOUNTER — Telehealth: Payer: Self-pay | Admitting: Family Medicine

## 2017-11-13 ENCOUNTER — Ambulatory Visit: Payer: Self-pay

## 2017-11-13 NOTE — Telephone Encounter (Signed)
Noted. Will await [facility] eval. 

## 2017-11-13 NOTE — Telephone Encounter (Signed)
Pt states she is having abdominal pain associated with constipation. Pt states she has been having chills and generally "doesn't feel good" States she has abdominal pain "all over her abdomen" and "it feels like my stomach is eating itself (she contributed to taking Ibuprofen for her H/A". She states she has had constipation all her life but its never gotten this bad. States she had a hard stool that had small amount of blood on stool. She has tried laxatives and enema. Pt sent to local [facility] to be evaluated ASAP.   Reason for Disposition . [1] Constant abdominal pain AND [2] present > 2 hours  Answer Assessment - Initial Assessment Questions 1. STOOL PATTERN OR FREQUENCY: "How often do you pass bowel movements (BMs)?"  (Normal range: tid to q 3 days)  "When was the last BM passed?"       Once a week normal bowel pattern Had a BM after an enema hard stool 2. STRAINING: "Do you have to strain to have a BM?"      yes 3. RECTAL PAIN: "Does your rectum hurt when the stool comes out?" If so, ask: "Do you have hemorrhoids? How bad is the pain?"  (Scale 1-10; or mild, moderate, severe)     Yes - She says she has hemorrhoids has mild to moderate pain  4. STOOL COMPOSITION: "Are the stools hard?"      Stool are hard 5. BLOOD ON STOOLS: "Has there been any blood on the toilet tissue or on the surface of the BM?" If so, ask: "When was the last time?"      Yes- 3 weeks ago 6. CHRONIC CONSTIPATION: "Is this a new problem for you?"  If no, ask: How long have you had this problem?" (days, weeks, months)      No- has had constipation"since I was born" 7. CHANGES IN DIET: "Have there been any recent changes in your diet?"      no 8. MEDICATIONS: "Have you been taking any new medications?"     no 9. LAXATIVES: "Have you been using any laxatives or enemas?"  If yes, ask "What, how often, and when was the last time?" Yes to laxatives and enema was dulcolax pill every day for a week and a half, then to  Dulcolax suppositories for 3 nights in a row. And then the enema last night 10. CAUSE: "What do you think is causing the constipation?"        "no idea" what is causing constipation 11. OTHER SYMPTOMS: "Do you have any other symptoms?" (e.g., abdominal pain, fever, vomiting) Shivering and weak, "horrible" headache 5/10, abdominal pain "all over" "feels like my stomach is eating itself" pt thinks its from Ibuprofen for her H/A 12. PREGNANCY: "Is there any chance you are pregnant?" "When was your last menstrual period?"       No LMP: 11/07/17  Protocols used: CONSTIPATION-A-AH

## 2017-11-13 NOTE — Telephone Encounter (Signed)
Copied from CRM [id number]. Topic: Quick Communication - See Telephone Encounter >> Nov 13, 2017 10:13 AM Landry MellowFoltz, Melissa J wrote: CRM for notification. See Telephone encounter for:   11/13/17. Pt has been having trouble with constipation for 3 weeks and is in a lot of pain today and would like some home care advice. Please call [phone]

## 2018-01-25 DIAGNOSIS — J189 Pneumonia, unspecified organism: Secondary | ICD-10-CM

## 2018-01-25 HISTORY — DX: Pneumonia, unspecified organism: J18.9

## 2018-02-05 ENCOUNTER — Ambulatory Visit: Payer: 59 | Admitting: Family Medicine

## 2018-02-07 ENCOUNTER — Ambulatory Visit (INDEPENDENT_AMBULATORY_CARE_PROVIDER_SITE_OTHER)
Admission: RE | Admit: 2018-02-07 | Discharge: 2018-02-07 | Disposition: A | Discharge status: Home / Self Care | Payer: 59 | Source: Ambulatory Visit | Attending: Family Medicine | Admitting: Family Medicine

## 2018-02-07 ENCOUNTER — Encounter: Payer: Self-pay | Admitting: Family Medicine

## 2018-02-07 ENCOUNTER — Ambulatory Visit (INDEPENDENT_AMBULATORY_CARE_PROVIDER_SITE_OTHER): Payer: 59 | Admitting: Family Medicine

## 2018-02-07 VITALS — BP 118/64 | HR 98 | Temp 99.5°F | Wt 130.2 lb

## 2018-02-07 DIAGNOSIS — R509 Fever, unspecified: Secondary | ICD-10-CM | POA: Diagnosis not present

## 2018-02-07 DIAGNOSIS — M545 Low back pain, unspecified: Secondary | ICD-10-CM

## 2018-02-07 DIAGNOSIS — F17200 Nicotine dependence, unspecified, uncomplicated: Secondary | ICD-10-CM | POA: Diagnosis not present

## 2018-02-07 DIAGNOSIS — R059 Cough, unspecified: Secondary | ICD-10-CM

## 2018-02-07 DIAGNOSIS — R05 Cough: Secondary | ICD-10-CM

## 2018-02-07 DIAGNOSIS — J189 Pneumonia, unspecified organism: Secondary | ICD-10-CM

## 2018-02-07 DIAGNOSIS — J181 Lobar pneumonia, unspecified organism: Secondary | ICD-10-CM | POA: Diagnosis not present

## 2018-02-07 LAB — POC INFLUENZA A&B (BINAX/QUICKVUE)
INFLUENZA A, POC: NEGATIVE
Influenza B, POC: NEGATIVE

## 2018-02-07 MED ORDER — DEXAMETHASONE SODIUM PHOSPHATE 10 MG/ML IJ SOLN
10.0000 mg | Freq: Once | INTRAMUSCULAR | Status: AC
  Administered 2018-02-07: 10 mg via INTRAMUSCULAR

## 2018-02-07 MED ORDER — HYDROCODONE-ACETAMINOPHEN 5-325 MG PO TABS: 1.0000 | ORAL_TABLET | Freq: Three times a day (TID) | ORAL | 0 refills | Status: DC | PRN

## 2018-02-07 MED ORDER — AZITHROMYCIN 250 MG PO TABS: ORAL_TABLET | ORAL | 0 refills | Status: DC

## 2018-02-07 MED ORDER — METHOCARBAMOL 500 MG PO TABS: 500.0000 mg | ORAL_TABLET | Freq: Three times a day (TID) | ORAL | 0 refills | Status: DC | PRN

## 2018-02-07 NOTE — Assessment & Plan Note (Signed)
Action phase - continue to encourage full cessation.

## 2018-02-07 NOTE — Patient Instructions (Addendum)
xrays looking ok. We will be in touch if any change in plan based on radiologist read.  I think you have acute bronchitis - treat with azithromycin course sent to your pharmacy. Continue codeine cough syrup to help suppress cough (don't take with hydrocodone).  For back - steroid shot today. Use muscle relaxant as needed. Use ice and heat to back (never directly on the skin, no more than 10-15 min at a time). Continue alternating tylenol 500mg  /ibuporfen 600mg  every 4 hours, may use hydrocodone for breakthrough pain. Update me with how you're doing over weekend.  We have treated you with a steroid shot.

## 2018-02-07 NOTE — Progress Notes (Signed)
BP 118/64 (BP Location: Left Arm, Patient Position: Sitting, Cuff Size: Normal)   Pulse 98   Temp 99.5 F (37.5 C) (Oral)   Wt 130 lb 4 oz (59.1 kg)   LMP 01/27/2018   SpO2 95%   BMI 23.07 kg/m    CC: cough, back pain Subjective:    Patient ID: Jacqueline [name], female    DOB: 1986/09/08, 32 y.o.   MRN: [id number]  HPI: Ilaisaane Marts is a 32 y.o. female presenting on 02/07/2018 for Cough (Producutive cough started 01/23/18. Also, c/o chills, fever- max 102 and body aches, N/V/D. Tried Tylenol, Dayquil and ibuprofen.) and Back Pain (Threw out back during a coughing episode. Took Flexeril about 8:15 this morning.)   Here with husband today.  High stress recently. Father very sick in [place] with cancer - she has been taking care of him. Just arrived from airport last night.   2 wk h/o productive cough associated with headache, Tmax 102 started 1/30 - persistent fever over the last 2 weeks. Last fever was yesterday up to 101. Severe body aches.  Denies ear or tooth pain, sore throat.   Coughing fit this morning caused severe sudden R back pain. No leg pain. Legs feel weak. No inciting trauma/injury. No numbness of legs. No bowel/bladder accidents.   Alternating ibuprofen/tylenol. Also took flexeril this morning Treating with codeine cough syrup.   Current smoker - but down to 1-2 cig/day. LMP 01/28/2018  Relevant past medical, surgical, family and social history reviewed and updated as indicated. Interim medical history since our last visit reviewed. Allergies and medications reviewed and updated. Outpatient Medications Prior to Visit  Medication Sig Dispense Refill  . fludrocortisone (FLORINEF) 0.1 MG tablet Take 1 tablet (0.1 mg total) by mouth daily. 90 tablet 3  . NUVARING 0.12-0.015 MG/24HR vaginal ring PLACE 1 RING VAGINALLY MONTHLY. LEAVE IN PLACE FOR 3 CONSECUTIVE WEEKS, THEN REMOVE FOR 1 WEEK.  6  . triamcinolone cream (KENALOG) 0.1 % Apply 1 application topically 2 (two) times  daily. Apply to AA. 60 g 0   No facility-administered medications prior to visit.      Per HPI unless specifically indicated in ROS section below Review of Systems     Objective:    BP 118/64 (BP Location: Left Arm, Patient Position: Sitting, Cuff Size: Normal)   Pulse 98   Temp 99.5 F (37.5 C) (Oral)   Wt 130 lb 4 oz (59.1 kg)   LMP 01/27/2018   SpO2 95%   BMI 23.07 kg/m   Wt Readings from Last 3 Encounters:  02/07/18 130 lb 4 oz (59.1 kg)  09/26/17 132 lb 12 oz (60.2 kg)  08/20/17 133 lb 6.4 oz (60.5 kg)    Physical Exam  Constitutional: She is oriented to person, place, and time. She appears well-developed and well-nourished. No distress.  HENT:  Head: Normocephalic and atraumatic.  Right Ear: Hearing, tympanic membrane, external ear and ear canal normal.  Left Ear: Hearing, tympanic membrane, external ear and ear canal normal.  Nose: Mucosal edema present. No rhinorrhea. Right sinus exhibits no maxillary sinus tenderness and no frontal sinus tenderness. Left sinus exhibits no maxillary sinus tenderness and no frontal sinus tenderness.  Mouth/Throat: Uvula is midline and mucous membranes are normal. Posterior oropharyngeal erythema present. No oropharyngeal exudate, posterior oropharyngeal edema or tonsillar abscesses.  Eyes: Conjunctivae and EOM are normal. Pupils are equal, round, and reactive to light. No scleral icterus.  Neck: Normal range of motion. Neck supple.  Cardiovascular: Normal  rate, regular rhythm, normal heart sounds and intact distal pulses.  No murmur heard. Pulmonary/Chest: Effort normal. No respiratory distress. She has no decreased breath sounds. She has no wheezes. She has rhonchi (bilateral lower lobes). She has no rales.  Musculoskeletal: She exhibits no edema.  Pain limits exam  Point tenderness midline L3-5 lumbar spine as well as paraspinous mm R>L Pain with seated straight leg rause but without shooting pain down legs Unable to evaluate supine  due to pain.   Lymphadenopathy:    She has no cervical adenopathy.  Neurological: She is alert and oriented to person, place, and time.  Antalgic gait  Skin: Skin is warm and dry. No rash noted.  Nursing note and vitals reviewed.  Results for orders placed or performed in visit on 02/07/18  POC Influenza A&B(BINAX/QUICKVUE)  Result Value Ref Range   Influenza A, POC Negative Negative   Influenza B, POC Negative Negative  DG Lumbar Spine Complete CLINICAL DATA:  Onset of severe back pain since coughing approximately 3 hours ago. Difficulty with movement.  EXAM: LUMBAR SPINE - COMPLETE 4+ VIEW  COMPARISON:  None in PACs  FINDINGS: The twelfth ribs are small. The lumbar vertebral bodies are preserved in height. There is partial sacralization of the transverse process on the left of L5. The pedicles are intact. The disc space heights are well maintained. There is no spondylolisthesis. The facet joints exhibit no significant hypertrophy.  IMPRESSION: There is no acute or significant chronic bony abnormality of the lumbar spine.  Electronically Signed   By: David  [place] M.D.   On: 02/07/2018 14:53 DG Chest 2 View CLINICAL DATA:  Coughing episode 3 hours ago with persistent back pain since then. Movement is difficult. Current smoker.  EXAM: CHEST  2 VIEW  COMPARISON:  None in PACs  FINDINGS: The lungs are well-expanded. There is patchy infiltrate in the left lower lobe. There is no pneumothorax or pleural effusion. The heart and mediastinal structures are normal. The thoracic vertebral bodies are preserved in height. The observed portions of the ribs are normal.  IMPRESSION: Patchy atelectasis or developing pneumonia in the left lower lobe. No pleural effusion or pneumothorax. Follow-up radiographs are recommended following completion of anticipated antibiotic therapy unless the patient's symptoms completely resolve.  Electronically Signed   By: David  [place]  M.D.   On: 02/07/2018 14:51      Assessment & Plan:   Problem List Items Addressed This Visit    Acute right-sided low back pain without sciatica    Acute pain started today after severe coughing fit. Lumbar strain r/o lumbar fracture or HNP. Check films today given severity of pain.  Rx dexamethasone 10mg  IM, robaxin muscle relaxant, alternating tylenol/ibuprofen, and hydrocodone for breakthrough pain.  [place] CSRS reviewed and appropriate.       Relevant Medications   HYDROcodone-acetaminophen (NORCO/VICODIN) 5-325 MG tablet   methocarbamol (ROBAXIN) 500 MG tablet   dexamethasone (DECADRON) injection 10 mg (Completed)   Other Relevant Orders   DG Lumbar Spine Complete (Completed)   Left lower lobe pneumonia ([facility]CC) - Primary    Productive cough with fever of 2 weeks duration - concern for pneumonia. Check CXR - I don't see obvious pneumonia. Will cover for bronchitis with zpack antibiotic. She has cheratussin to use at home. No other localizing sign of infection.  ADDENDUM ==> xray showed possible developing pneumonia LLL. zpack should cover infection.       Relevant Medications   azithromycin (ZITHROMAX) 250 MG tablet  Other Relevant Orders   DG Chest 2 View (Completed)   Smoker    Action phase - continue to encourage full cessation.        Other Visit Diagnoses    Fever, unspecified fever cause       Relevant Orders   POC Influenza A&B(BINAX/QUICKVUE) (Completed)       Meds ordered this encounter  Medications  . HYDROcodone-acetaminophen (NORCO/VICODIN) 5-325 MG tablet    Sig: Take 1 tablet by mouth 3 (three) times daily as needed for moderate pain.    Dispense:  15 tablet    Refill:  0  . methocarbamol (ROBAXIN) 500 MG tablet    Sig: Take 1 tablet (500 mg total) by mouth every 8 (eight) hours as needed for muscle spasms.    Dispense:  30 tablet    Refill:  0  . azithromycin (ZITHROMAX) 250 MG tablet    Sig: Take two tablets on day one followed by one tablet on days  2-5    Dispense:  6 each    Refill:  0  . dexamethasone (DECADRON) injection 10 mg   Orders Placed This Encounter  Procedures  . DG Chest 2 View    Standing Status:   Future    Number of Occurrences:   1    Standing Expiration Date:   04/08/2019    Order Specific Question:   Reason for Exam (SYMPTOM  OR DIAGNOSIS REQUIRED)    Answer:   cough, fever x 2 week    Order Specific Question:   Is patient pregnant?    Answer:   No    Order Specific Question:   Preferred imaging location?    Answer[facility]    Order Specific Question:   Radiology Contrast Protocol - do NOT remove file path    Answer:   \\[email or web address]\DXFluoroContrastProtocols.pdf  . DG Lumbar Spine Complete    Standing Status:   Future    Number of Occurrences:   1    Standing Expiration Date:   04/08/2019    Order Specific Question:   Reason for Exam (SYMPTOM  OR DIAGNOSIS REQUIRED)    Answer:   acute severe right lower back pain from severe cough    Order Specific Question:   Is patient pregnant?    Answer:   No    Order Specific Question:   Preferred imaging location?    Answer:   [facility]    Order Specific Question:   Radiology Contrast Protocol - do NOT remove file path    Answer:   \\[email or web address]\Radiant\DXFluoroContrastProtocols.pdf  . POC Influenza A&B(BINAX/QUICKVUE)    Follow up plan: Return if symptoms worsen or fail to improve.  Eustaquio BoydenJavier Cola Gane, MD

## 2018-02-07 NOTE — Assessment & Plan Note (Addendum)
Productive cough with fever of 2 weeks duration - concern for pneumonia. Check CXR - I don't see obvious pneumonia. Will cover for bronchitis with zpack antibiotic. She has cheratussin to use at home. No other localizing sign of infection.  ADDENDUM ==> xray showed possible developing pneumonia LLL. zpack should cover infection.

## 2018-02-07 NOTE — Assessment & Plan Note (Signed)
Acute pain started today after severe coughing fit. Lumbar strain r/o lumbar fracture or HNP. Check films today given severity of pain.  Rx dexamethasone 10mg  IM, robaxin muscle relaxant, alternating tylenol/ibuprofen, and hydrocodone for breakthrough pain.   CSRS reviewed and appropriate.

## 2018-02-12 ENCOUNTER — Ambulatory Visit (INDEPENDENT_AMBULATORY_CARE_PROVIDER_SITE_OTHER): Payer: 59 | Admitting: Family Medicine

## 2018-02-12 ENCOUNTER — Ambulatory Visit: Payer: Self-pay

## 2018-02-12 ENCOUNTER — Encounter: Payer: Self-pay | Admitting: Family Medicine

## 2018-02-12 VITALS — BP 118/74 | HR 93 | Temp 98.2°F | Wt 132.0 lb

## 2018-02-12 DIAGNOSIS — J189 Pneumonia, unspecified organism: Secondary | ICD-10-CM

## 2018-02-12 DIAGNOSIS — J181 Lobar pneumonia, unspecified organism: Secondary | ICD-10-CM | POA: Diagnosis not present

## 2018-02-12 DIAGNOSIS — M545 Low back pain, unspecified: Secondary | ICD-10-CM

## 2018-02-12 MED ORDER — HYDROCODONE-ACETAMINOPHEN 5-325 MG PO TABS: 1.0000 | ORAL_TABLET | Freq: Three times a day (TID) | ORAL | 0 refills | Status: DC | PRN

## 2018-02-12 MED ORDER — PREDNISONE 20 MG PO TABS: ORAL_TABLET | ORAL | 0 refills | Status: DC

## 2018-02-12 NOTE — Assessment & Plan Note (Signed)
Improving  Discussed RTC 1 mo for rpt CXR. Ordered today.

## 2018-02-12 NOTE — Progress Notes (Signed)
BP 118/74 (BP Location: Left Arm, Patient Position: Sitting, Cuff Size: Normal)   Pulse 93   Temp 98.2 F (36.8 C) (Oral)   Wt 132 lb (59.9 kg)   LMP 01/27/2018   SpO2 99%   BMI 23.38 kg/m    CC: f/u visit - see prior note for details Subjective:    Patient ID: Jacqueline [name], female    DOB: [birth year], 32 y.o.   MRN: [id number]  HPI: Jacqueline [name] is a 32 y.o. female presenting on 02/12/2018 for Back Pain (Still has lower back pain, bilateral but worse on right. Now having numbess in toes on both feet.); Urinary Incontinence (Sometimes wets herself when coughing. Other times not aware she has urinated.); and Form Completion (Pt has work form to be completed.)   Seen here last week with diagnosis LLL PNA by CXR treated with azithromycin course. She also had severe R sided lower back pain from coughing presumed HNP, xrays were reassuring. Treated with IM dexamethasone 10mg  shot as well as robaxin/vicodin for breakthrough pain. No significant improvement. New symptoms of bilateral numbness 2nd/3rd/4th toes as well as new urinary accidents - cannot control.   Cough, fever, congestion has improved with azithromycin.  Requests I fill out FMLA forms.  Relevant past medical, surgical, family and social history reviewed and updated as indicated. Interim medical history since our last visit reviewed. Allergies and medications reviewed and updated. Outpatient Medications Prior to Visit  Medication Sig Dispense Refill  . methocarbamol (ROBAXIN) 500 MG tablet Take 1 tablet (500 mg total) by mouth every 8 (eight) hours as needed for muscle spasms. 30 tablet 0  . HYDROcodone-acetaminophen (NORCO/VICODIN) 5-325 MG tablet Take 1 tablet by mouth 3 (three) times daily as needed for moderate pain. 15 tablet 0  . azithromycin (ZITHROMAX) 250 MG tablet Take two tablets on day one followed by one tablet on days 2-5 6 each 0   No facility-administered medications prior to visit.      Per HPI unless  specifically indicated in ROS section below Review of Systems     Objective:    BP 118/74 (BP Location: Left Arm, Patient Position: Sitting, Cuff Size: Normal)   Pulse 93   Temp 98.2 F (36.8 C) (Oral)   Wt 132 lb (59.9 kg)   LMP 01/27/2018   SpO2 99%   BMI 23.38 kg/m   Wt Readings from Last 3 Encounters:  02/12/18 132 lb (59.9 kg)  02/07/18 130 lb 4 oz (59.1 kg)  09/26/17 132 lb 12 oz (60.2 kg)    Physical Exam  Constitutional: She appears well-developed and well-nourished. No distress.  Musculoskeletal: She exhibits no edema.  Neurological: She has normal strength. A sensory deficit (diminished sensation to light touch R toes 1-5) is present.  Reflex Scores:      Patellar reflexes are 3+ on the right side and 3+ on the left side. Diminished strength BLE in flexion/extension at knee  Skin: Skin is warm and dry. No rash noted.  Nursing note and vitals reviewed.  DG Lumbar Spine Complete CLINICAL DATA:  Onset of severe back pain since coughing approximately 3 hours ago. Difficulty with movement.  EXAM: LUMBAR SPINE - COMPLETE 4+ VIEW  COMPARISON:  None in PACs  FINDINGS: The twelfth ribs are small. The lumbar vertebral bodies are preserved in height. There is partial sacralization of the transverse process on the left of L5. The pedicles are intact. The disc space heights are well maintained. There is no spondylolisthesis. The facet joints  exhibit no significant hypertrophy.  IMPRESSION: There is no acute or significant chronic bony abnormality of the lumbar spine.  Electronically Signed   By: David  [place] M.D.   On: 02/07/2018 14:53 DG Chest 2 View CLINICAL DATA:  Coughing episode 3 hours ago with persistent back pain since then. Movement is difficult. Current smoker.  EXAM: CHEST  2 VIEW  COMPARISON:  None in PACs  FINDINGS: The lungs are well-expanded. There is patchy infiltrate in the left lower lobe. There is no pneumothorax or pleural effusion.  The heart and mediastinal structures are normal. The thoracic vertebral bodies are preserved in height. The observed portions of the ribs are normal.  IMPRESSION: Patchy atelectasis or developing pneumonia in the left lower lobe. No pleural effusion or pneumothorax. Follow-up radiographs are recommended following completion of anticipated antibiotic therapy unless the patient's symptoms completely resolve.  Electronically Signed   By: David  [place] M.D.   On: 02/07/2018 14:51      Assessment & Plan:   Problem List Items Addressed This Visit    Acute right-sided low back pain without sciatica - Primary    Slight improvement from last week, but with new neurological symptoms of toe numbness R>L and bladder incontinence/accidents (unable to control, does not feel it coming on). Will order STAT MRI lumbar spine to eval for cord compression. Will refill vicodin Rx. Will Rx prednisone taper. To ER if worsening in interim. Pt and husband agree with plan.       Relevant Medications   HYDROcodone-acetaminophen (NORCO/VICODIN) 5-325 MG tablet   predniSONE (DELTASONE) 20 MG tablet   Other Relevant Orders   MR Lumbar Spine Wo Contrast   Left lower lobe pneumonia (HCC)    Improving  Discussed RTC 1 mo for rpt CXR. Ordered today.       Relevant Orders   DG Chest 2 View       Meds ordered this encounter  Medications  . HYDROcodone-acetaminophen (NORCO/VICODIN) 5-325 MG tablet    Sig: Take 1 tablet by mouth 3 (three) times daily as needed for moderate pain.    Dispense:  15 tablet    Refill:  0  . predniSONE (DELTASONE) 20 MG tablet    Sig: Take two tablets daily for 3 days followed by one tablet daily for 4 days    Dispense:  10 tablet    Refill:  0   Orders Placed This Encounter  Procedures  . MR Lumbar Spine Wo Contrast    Epic order Wt. 132 /  Ht 5'3 / no claus/ no metal in eyes/ no lumbar sx/no implants/ no needs Ins-[facility] Pda/pt     Standing Status:   Future    Standing  Expiration Date:   04/13/2019    Order Specific Question:   What is the patient's sedation requirement?    Answer:   No Sedation    Order Specific Question:   Does the patient have a pacemaker or implanted devices?    Answer:   No    Order Specific Question:   Preferred imaging location?    Answer:   [facility] (table limit-550lbs)    Order Specific Question:   Call Results- Best Contact Number?    Answer:   [phone]    Order Specific Question:   Radiology Contrast Protocol - do NOT remove file path    Answer:   \\charchive\epicdata\Radiant\[email or web address].PDF  . DG Chest 2 View    Standing Status:   Future    Standing Expiration  Date:   04/13/2019    Order Specific Question:   Reason for Exam (SYMPTOM  OR DIAGNOSIS REQUIRED)    Answer:   f/u LLL PNA    Order Specific Question:   Is patient pregnant?    Answer:   No    Order Specific Question:   Preferred imaging location?    Answer:   [facility]    Order Specific Question:   Radiology Contrast Protocol - do NOT remove file path    Answer:   \\charchive\[email or web address]\Radiant\[email or web address].pdf    Follow up plan: Return if symptoms worsen or fail to improve.  Eustaquio BoydenJavier Olin Gurski, MD

## 2018-02-12 NOTE — Assessment & Plan Note (Signed)
Slight improvement from last week, but with new neurological symptoms of toe numbness R>L and bladder incontinence/accidents (unable to control, does not feel it coming on). Will order STAT MRI lumbar spine to eval for cord compression. Will refill vicodin Rx. Will Rx prednisone taper. To ER if worsening in interim. Pt and husband agree with plan.

## 2018-02-12 NOTE — Telephone Encounter (Signed)
Noted. Will see today.  

## 2018-02-12 NOTE — Patient Instructions (Signed)
For worsening back pain with new neurological symptoms I think best to check MRI to evaluate for herniated disc - see Shirlee LimerickMarion on your way out.  Pain medicine refilled.

## 2018-02-12 NOTE — Telephone Encounter (Signed)
Reports still having low back pain - right side. Having numbness in both middle toes and some bladder control issues when she coughs. Appointment made for today. Reason for Disposition . Numbness in a leg or foot (i.e., loss of sensation)  Answer Assessment - Initial Assessment Questions 1. ONSET: "When did the pain begin?"      Started last week 2. LOCATION: "Where does it hurt?" (upper, mid or lower back)     Lower right back 3. SEVERITY: "How bad is the pain?"  (e.g., Scale 1-10; mild, moderate, or severe)   - MILD (1-3): doesn't interfere with normal activities    - MODERATE (4-7): interferes with normal activities or awakens from sleep    - SEVERE (8-10): excruciating pain, unable to do any normal activities      8 4. PATTERN: "Is the pain constant?" (e.g., yes, no; constant, intermittent)      Constant 5. RADIATION: "Does the pain shoot into your legs or elsewhere?"     Both feet- middle toes are numbness 6. CAUSE:  "What do you think is causing the back pain?"      Threw her back out 7. BACK OVERUSE:  "Any recent lifting of heavy objects, strenuous work or exercise?"     No 8. MEDICATIONS: "What have you taken so far for the pain?" (e.g., nothing, acetaminophen, NSAIDS)     Robaxin and hydrocodone 9. NEUROLOGIC SYMPTOMS: "Do you have any weakness, numbness, or problems with bowel/bladder control?"     When she coughs , bladder leaks 10. OTHER SYMPTOMS: "Do you have any other symptoms?" (e.g., fever, abdominal pain, burning with urination, blood in urine)       No 11. PREGNANCY: "Is there any chance you are pregnant?" (e.g., yes, no; LMP)       No  Protocols used: BACK PAIN-A-AH

## 2018-02-13 ENCOUNTER — Ambulatory Visit
Admission: RE | Admit: 2018-02-13 | Discharge: 2018-02-13 | Disposition: A | Discharge status: Home / Self Care | Payer: 59 | Source: Ambulatory Visit | Attending: Family Medicine | Admitting: Family Medicine

## 2018-02-13 DIAGNOSIS — M545 Low back pain, unspecified: Secondary | ICD-10-CM

## 2018-02-15 ENCOUNTER — Telehealth: Payer: Self-pay | Admitting: Family Medicine

## 2018-02-15 NOTE — Telephone Encounter (Signed)
Dr. Reece AgarG, do you have this?

## 2018-02-15 NOTE — Telephone Encounter (Signed)
Misty StanleyLisa Do you have this paperwork?  Copied from CRM [phone]. Topic: Inquiry >> Feb 15, 2018 10:10 AM Floria RavelingStovall, Shana A wrote: Reason for CRM:  Pt Husband called in checking on the status of pt FMLA paperwork that was dropped off Tuesday of this week.  She needs to get it back to them ASAP   Best number [phone]

## 2018-02-15 NOTE — Telephone Encounter (Addendum)
Filled out and in Jacqueline [name]'s box. Can you get an update on how pt is doing? If no better this week, will recommend referral to spine doctor.

## 2018-02-15 NOTE — Telephone Encounter (Signed)
Attempted to contact pt. No answer. VM box is full.  Need to inform pt her FMLA paperwork will be available to pick up Mon, 02/18/18.  Also, Dr. Reece AgarG is asking for update on her back pain.

## 2018-02-18 NOTE — Telephone Encounter (Signed)
Spoke with pt notifying her the paperwork is ready to pick up.  Says ok.    Also, pt says back pain is much better but it's still there.

## 2018-02-20 ENCOUNTER — Telehealth: Payer: Self-pay | Admitting: Family Medicine

## 2018-02-20 NOTE — Telephone Encounter (Signed)
[employer] faxed paperwork to be filled out Pt stated it was for her visit 2/14 and 2/19. In dr g in box for review and signature

## 2018-02-21 NOTE — Telephone Encounter (Signed)
Filled out and in Lisa's box.  plz get date she stopped working to fill out on form.  plz get update on how back is - if persistent pain, will recommend referral to back doctor. Would also ask about anticipated return to work date for form.

## 2018-02-22 NOTE — Telephone Encounter (Signed)
Spoke with pt asking about dates for her form.  States the stop date was 01/03/18. And she anticipates retuning to work 03/26/18. Notified pt I will enter these dates, fax the form and it will be ready to pick up. Says ok.  Faxed form.  [Placed form at front office.]  As for the back pain, pt stopped steroid 3 days ago.  Felt fine the first day but yesterday and today feels like she is going backwards.

## 2018-03-25 ENCOUNTER — Telehealth: Payer: Self-pay | Admitting: Family Medicine

## 2018-03-25 DIAGNOSIS — Z0279 Encounter for issue of other medical certificate: Secondary | ICD-10-CM

## 2018-03-25 NOTE — Telephone Encounter (Signed)
Pt dropped off a work request for medical information. Placed in Rx tower.

## 2018-03-25 NOTE — Telephone Encounter (Signed)
Placed form in Dr. G's box.  

## 2018-03-26 ENCOUNTER — Telehealth: Payer: Self-pay

## 2018-03-26 NOTE — Telephone Encounter (Signed)
Copied from CRM [id number]. Topic: Inquiry >> Mar 26, 2018  4:51 PM Raquel SarnaHayes, Jacqueline G wrote: Pt calling back to check on status of paperwork being signed off on. Please call pt to let her know status.

## 2018-03-27 NOTE — Telephone Encounter (Addendum)
Form filled and in Jacqueline [name]'s box.  plz ensure she is ok with me attaching copy of chest xray and lumbar MRI with forms to send in.

## 2018-03-27 NOTE — Telephone Encounter (Signed)
Spoke with pt notifying the form is ready to pick up. Says she is ok with attachments.  Also, informed pt there is a $5.00 fee for completion of the form.  Verbalizes understanding.   [Placed form at front office.]

## 2018-04-01 ENCOUNTER — Telehealth: Payer: Self-pay | Admitting: Family Medicine

## 2018-04-01 DIAGNOSIS — M545 Low back pain, unspecified: Secondary | ICD-10-CM

## 2018-04-01 NOTE — Telephone Encounter (Signed)
Spoke with pt to let her know dr copland is a sport med dr not ortho.  She stated she needed a referral for ortho. She would like to go to [facility].  I asked if this was a workers comp claim she stated it was not.   Copied from CRM [id number]. Topic: Appointment Scheduling - Scheduling Inquiry for Clinic >> Apr 01, 2018  1:10 PM [name], Jacqueline A wrote: Reason for CRM: pt called in and would like to see DR copland for Ortho for her back.  Per her job she has to see a ortho dr to come back to work.  Please contact her at [phone]

## 2018-04-01 NOTE — Telephone Encounter (Signed)
Ortho referral placed

## 2018-04-02 NOTE — Telephone Encounter (Signed)
See referral notes-Suzanne Garbers V Shanty Ginty, RMA

## 2018-05-14 ENCOUNTER — Ambulatory Visit: Payer: 59 | Attending: Orthopedic Surgery | Admitting: Physical Therapy

## 2018-05-14 ENCOUNTER — Encounter: Payer: Self-pay | Admitting: Physical Therapy

## 2018-05-14 DIAGNOSIS — M6281 Muscle weakness (generalized): Secondary | ICD-10-CM

## 2018-05-14 DIAGNOSIS — M2560 Stiffness of unspecified joint, not elsewhere classified: Secondary | ICD-10-CM | POA: Diagnosis present

## 2018-05-14 DIAGNOSIS — M5441 Lumbago with sciatica, right side: Secondary | ICD-10-CM | POA: Diagnosis not present

## 2018-05-14 DIAGNOSIS — G8929 Other chronic pain: Secondary | ICD-10-CM | POA: Diagnosis present

## 2018-05-16 ENCOUNTER — Ambulatory Visit: Payer: 59 | Admitting: Physical Therapy

## 2018-05-16 DIAGNOSIS — M2560 Stiffness of unspecified joint, not elsewhere classified: Secondary | ICD-10-CM

## 2018-05-16 DIAGNOSIS — M6281 Muscle weakness (generalized): Secondary | ICD-10-CM

## 2018-05-16 DIAGNOSIS — G8929 Other chronic pain: Secondary | ICD-10-CM

## 2018-05-16 DIAGNOSIS — M5441 Lumbago with sciatica, right side: Secondary | ICD-10-CM | POA: Diagnosis not present

## 2018-05-18 NOTE — Therapy (Signed)
[facility] [address] [place], [place], [address] Phone: [phone]   Fax:  [phone]  Physical Therapy Treatment  Patient Details  Name: Jacqueline [name] MRN: [id number] Date of Birth: June 14, 1986 Referring Provider: Colan Neptune, MD   Encounter Date: 05/16/2018  PT End of Session - 05/18/18 1454    Visit Number  2    Number of Visits  8    Date for PT Re-Evaluation  06/11/18    PT Start Time  0731    PT Stop Time  0824    PT Time Calculation (min)  53 min    Activity Tolerance  Patient tolerated treatment well;Patient limited by pain    Behavior During Therapy  [facility] for tasks assessed/performed       Past Medical History:  Diagnosis Date  . IBS (irritable bowel syndrome)    h/o this  . Smoker     Past Surgical History:  Procedure Laterality Date  . CESAREAN SECTION  2012  . CHOLECYSTECTOMY  2005  . COLONOSCOPY  2006   WNL per pt  . ROTATOR CUFF REPAIR  2004   right  . SHOULDER SURGERY  2012   bone spurs; right    There were no vitals filed for this visit.  Subjective Assessment - 05/18/18 1449    Subjective  Pt. reports feeling "stiff" this morning.  Pt. did purchase and use an inversion table since last PT visit.  Pt. states it was difficult and painful.  PT recommends pt. continue to attempt at varying times/ degrees.      Pertinent History  Pt. has benefitted from use of Prednisone and has received 1 injection a few weeks ago.  Pt. returns to MD 06/12/18.  Pt. has not tried back brace.      Limitations  Sitting;Standing;Walking;House hold activities    Patient Stated Goals  Decrease LBP/ return to work (mid-August).      Currently in Pain?  Yes    Pain Score  1     Pain Location  Back    Pain Orientation  Right;Lower    Pain Descriptors / Indicators  Aching    Pain Type  Chronic pain         [facility] PT Assessment - 05/18/18 0001      Assessment   Medical Diagnosis  Radiculopathy, lumbar region/  Spondylosis without myelopathy/ Low back pain    Referring Provider  Colan Neptune, MD    Onset Date/Surgical Date  01/25/18    Next MD Visit  06/12/18    Prior Therapy  No         Treatment:  Therex.:   Reviewed HEP (modified pelvic tilts) Issued page #1-2 of core stability/ tactile cuing for TrA muscle contraction  Manual tx.:  Supine LE/lumbar stretches (15 min.)  E-stim:  Portable TENS:  IFC to lumbar region at 40% scan (as tolerated intensity of 7 mA) Pt. Instructed in proper set up and use during prone position/ supine core stability ex.  Moist heat to lumbar region in prone after tx.      PT Education - 05/18/18 1453    Education provided  Yes    Education Details  Issued core program (page #1-2) and trial TENS unit for pain control during inversion table use.     Person(s) Educated  Patient    Methods  Explanation;Demonstration;Handout    Comprehension  Verbalized understanding;Returned demonstration          PT  Long Term Goals - 05/18/18 1429      PT LONG TERM GOAL #1   Title  Pt. independent with HEP to increase lumbar AROM 50% all planes of movement with no increase c/o pain to improve daily tasks.     Baseline  Pt. presents decrease lumbar AROM: flexion (50% limited), extension 100% limited (neutral)- pain limited, R rotn. (75% limited)- pain/ L rotn. (25% limited).     Time  4    Period  Weeks    Status  New    Target Date  06/11/18      PT LONG TERM GOAL #2   Title  Pt. will increase core/LE muscle strength 1/2 muscle grade to improve pain-free mobility.      Baseline  B LE muscle strength grossly 4+/5 MMT except hip flexion 4/5 MMT.    Time  4    Period  Weeks    Status  New    Target Date  06/11/18      PT LONG TERM GOAL #3   Title  Pt. will demonstrate proper body/ lifting mechanics with no c/o low back pain.      Baseline  increase pain with lifting/ limited lumbar ROM.      Time  4    Period  Weeks    Status  New    Target Date   06/11/18      PT LONG TERM GOAL #4   Title  Pt. will increase FOTO from 46 to 66 to improve pain-fre mobility with daily tasks.      Baseline  FOTO baseline on 5/21: 46.     Time  4    Period  Weeks    Status  New    Target Date  06/11/18            Plan - 05/18/18 1455    Clinical Impression Statement  Pt. pain limited with any position involving lumbar extension/ pelvic tilts.  Moderate hypomobility in lumbar spine with grade I-II PA mobs.  Pt. able to contract/ maintain TrA muscle contraction but tactile cuing required.  Pt. reports benefit from use of TENS unit to lumbar region and able to manage independently.      Clinical Presentation  Evolving    Clinical Decision Making  Moderate    Rehab Potential  Good    PT Frequency  2x / week    PT Duration  4 weeks    PT Treatment/Interventions  ADLs/Self Care Home Management;Aquatic Therapy;Moist Heat;Electrical Stimulation;Cryotherapy;Traction;Ultrasound;Therapeutic activities;Functional mobility training;Stair training;Gait training;Therapeutic exercise;Balance training;Neuromuscular re-education;Patient/family education;Manual techniques;Passive range of motion;Dry needling    PT Next Visit Plan  Progress Core stability/ Discuss TENS use and inversion table.         Patient will benefit from skilled therapeutic intervention in order to improve the following deficits and impairments:  Pain, Improper body mechanics, Hypomobility, Decreased strength, Decreased range of motion, Decreased endurance, Decreased activity tolerance, Difficulty walking  Visit Diagnosis: Chronic right-sided low back pain with right-sided sciatica  Muscle weakness (generalized)  Joint stiffness of spine     Problem List Patient Active Problem List   Diagnosis Date Noted  . Acute right-sided low back pain without sciatica 02/07/2018  . Injury of right ankle 08/13/2017  . Left lower lobe pneumonia (HCC) 06/20/2017  . Skin rash 06/06/2017  .  Syncope 05/17/2017  . Near syncope 02/21/2017  . Kidney stones 11/22/2016  . Healthcare maintenance 02/13/2013  . Smoker   . IBS (irritable bowel  syndrome)    Cammie Mcgee, PT, DPT # [phone] 05/18/2018, 3:06 PM  [facility] [address] [place], [place], [address] Phone: [phone]   Fax:  [phone]  Name: Jacqueline [name] MRN: [id number] Date of Birth: 1986/08/25

## 2018-05-18 NOTE — Therapy (Signed)
[facility] [address]. [place], [place], [address] Phone: [phone]   Fax:  [phone]  Physical Therapy Evaluation  Patient Details  Name: Jacqueline [name] MRN: [id number] Date of Birth: July 12, 1986 Referring Provider: Colan Neptune, MD   Encounter Date: 05/14/2018  PT End of Session - 05/18/18 1420    Visit Number  1    Number of Visits  8    Date for PT Re-Evaluation  06/11/18    PT Start Time  0726    PT Stop Time  0825    PT Time Calculation (min)  59 min    Activity Tolerance  Patient tolerated treatment well;Patient limited by pain    Behavior During Therapy  [employer] for tasks assessed/performed       Past Medical History:  Diagnosis Date  . IBS (irritable bowel syndrome)    h/o this  . Smoker     Past Surgical History:  Procedure Laterality Date  . CESAREAN SECTION  2012  . CHOLECYSTECTOMY  2005  . COLONOSCOPY  2006   WNL per pt  . ROTATOR CUFF REPAIR  2004   right  . SHOULDER SURGERY  2012   bone spurs; right    There were no vitals filed for this visit.       [facility] PT Assessment - 05/18/18 0001      Assessment   Medical Diagnosis  Radiculopathy, lumbar region/ Spondylosis without myelopathy/ Low back pain    Referring Provider  Colan Neptune, MD    Onset Date/Surgical Date  01/25/18    Next MD Visit  06/12/18    Prior Therapy  No         Pt. report <1/10 low back pain currently in sitting position. >4/10 with prone press-ups.       See HEP     Pt. is a pleasant 32 y/o female with >4 months of low back pain. Pt. reports 1/10 R low back pain at rest and >4/10 pain with extension (prone/ standing). Pt. presents decrease lumbar AROM: flexion (50% limited), extension 100% limited (neutral)- pain limited, R rotn. (75% limited)- pain/ L rotn. (25% limited). Pt. ambulates with more normalized gait pattern compared to months ago. (+) R L1-3 tenderness. B LE muscle strength grossly 4+/5 MMT except hip  flexion 4/5 MMT. Good sensation. No change in bladder symptoms at this time. FOTO: baseline 46/ goal 66. Pt. will benefit from skilled PT services to increase core/LE stability and lumbar extension to improve pain-free mobility.     PT Education - 05/18/18 1420    Education provided  Yes    Education Details  See handouts/ Discussed inversion table    Person(s) Educated  Patient    Methods  Explanation;Demonstration;Handout    Comprehension  Verbalized understanding;Returned demonstration          PT Long Term Goals - 05/18/18 1429      PT LONG TERM GOAL #1   Title  Pt. independent with HEP to increase lumbar AROM 50% all planes of movement with no increase c/o pain to improve daily tasks.     Baseline  Pt. presents decrease lumbar AROM: flexion (50% limited), extension 100% limited (neutral)- pain limited, R rotn. (75% limited)- pain/ L rotn. (25% limited).     Time  4    Period  Weeks    Status  New    Target Date  06/11/18      PT LONG TERM GOAL #2  Title  Pt. will increase core/LE muscle strength 1/2 muscle grade to improve pain-free mobility.      Baseline  B LE muscle strength grossly 4+/5 MMT except hip flexion 4/5 MMT.    Time  4    Period  Weeks    Status  New    Target Date  06/11/18      PT LONG TERM GOAL #3   Title  Pt. will demonstrate proper body/ lifting mechanics with no c/o low back pain.      Baseline  increase pain with lifting/ limited lumbar ROM.      Time  4    Period  Weeks    Status  New    Target Date  06/11/18      PT LONG TERM GOAL #4   Title  Pt. will increase FOTO from 46 to 66 to improve pain-fre mobility with daily tasks.      Baseline  FOTO baseline on 5/21: 46.     Time  4    Period  Weeks    Status  New    Target Date  06/11/18          Patient will benefit from skilled therapeutic intervention in order to improve the following deficits and impairments:  Pain, Improper body mechanics, Hypomobility, Decreased strength, Decreased  range of motion, Decreased endurance, Decreased activity tolerance, Difficulty walking  Visit Diagnosis: Chronic right-sided low back pain with right-sided sciatica  Muscle weakness (generalized)  Joint stiffness of spine     Problem List Patient Active Problem List   Diagnosis Date Noted  . Acute right-sided low back pain without sciatica 02/07/2018  . Injury of right ankle 08/13/2017  . Left lower lobe pneumonia (HCC) 06/20/2017  . Skin rash 06/06/2017  . Syncope 05/17/2017  . Near syncope 02/21/2017  . Kidney stones 11/22/2016  . Healthcare maintenance 02/13/2013  . Smoker   . IBS (irritable bowel syndrome)    Cammie Mcgee, PT, DPT # [phone] 05/18/2018, 2:33 PM   [facility] [address] [place], [place], [address] Phone: [phone]   Fax:  [phone]  Name: Jacqueline [name] MRN: [id number] Date of Birth: Jul 16, 1986

## 2018-05-22 ENCOUNTER — Encounter: Payer: Self-pay | Admitting: Physical Therapy

## 2018-05-22 ENCOUNTER — Ambulatory Visit: Payer: 59 | Admitting: Physical Therapy

## 2018-05-22 DIAGNOSIS — M6281 Muscle weakness (generalized): Secondary | ICD-10-CM

## 2018-05-22 DIAGNOSIS — M2560 Stiffness of unspecified joint, not elsewhere classified: Secondary | ICD-10-CM

## 2018-05-22 DIAGNOSIS — M5441 Lumbago with sciatica, right side: Secondary | ICD-10-CM | POA: Diagnosis not present

## 2018-05-22 DIAGNOSIS — G8929 Other chronic pain: Secondary | ICD-10-CM

## 2018-05-22 NOTE — Therapy (Signed)
[facility] [address] [place], [place], [address] Phone: [phone]   Fax:  [phone]  Physical Therapy Treatment  Patient Details  Name: Jacqueline [name] MRN: [id number] Date of Birth: Apr 10, 1986 Referring Provider: Colan Neptune, MD   Encounter Date: 05/22/2018    Treatment 3 of 8.  Recert date: 06/11/18   Past Medical History:  Diagnosis Date  . IBS (irritable bowel syndrome)    h/o this  . Smoker     Past Surgical History:  Procedure Laterality Date  . CESAREAN SECTION  2012  . CHOLECYSTECTOMY  2005  . COLONOSCOPY  2006   WNL per pt  . ROTATOR CUFF REPAIR  2004   right  . SHOULDER SURGERY  2012   bone spurs; right    There were no vitals filed for this visit.     Pt. sore from mowing/ weedeating the yard last night. Pt. has to push/pull trampoline in backyard. Pt. reports soreness/ stiffness in low back. Pt. reports benefit from use of TENS unit.      Treatment:  Therex.:   TM walking 2.4 mph 10 min. (no increase pain)- warm-up.   Supine/ seated on ball core stability/ tactile cuing for TrA muscle contraction Quadruped ex. (slight lumbar extension noted/ no lumbar flexion with a straight spine noted).  Alternating hip extension.    Manual tx.:  Supine LE/lumbar stretches (11 min.) STM to lumbar paraspinals.    MH to lumbar spine in prone prior to manual tx./  Ice to low back in supine during LE stretches.       Pt. able to tolerate an slight increase in lumbar extension during supine theraball stretches/ standing lumbar ext. Lumbar spine hypomobility remains with limited flexion noted during quadruped ex. No change to HEP at this time with PT instruct to contract TrA t/o day with all household/ yard tasks.      PT Long Term Goals - 05/18/18 1429      PT LONG TERM GOAL #1   Title  Pt. independent with HEP to increase lumbar AROM 50% all planes of movement with no increase c/o pain to  improve daily tasks.     Baseline  Pt. presents decrease lumbar AROM: flexion (50% limited), extension 100% limited (neutral)- pain limited, R rotn. (75% limited)- pain/ L rotn. (25% limited).     Time  4    Period  Weeks    Status  New    Target Date  06/11/18      PT LONG TERM GOAL #2   Title  Pt. will increase core/LE muscle strength 1/2 muscle grade to improve pain-free mobility.      Baseline  B LE muscle strength grossly 4+/5 MMT except hip flexion 4/5 MMT.    Time  4    Period  Weeks    Status  New    Target Date  06/11/18      PT LONG TERM GOAL #3   Title  Pt. will demonstrate proper body/ lifting mechanics with no c/o low back pain.      Baseline  increase pain with lifting/ limited lumbar ROM.      Time  4    Period  Weeks    Status  New    Target Date  06/11/18      PT LONG TERM GOAL #4   Title  Pt. will increase FOTO from 46 to 66 to improve pain-fre mobility with daily tasks.  Baseline  FOTO baseline on 5/21: 46.     Time  4    Period  Weeks    Status  New    Target Date  06/11/18          Patient will benefit from skilled therapeutic intervention in order to improve the following deficits and impairments:  Pain, Improper body mechanics, Hypomobility, Decreased strength, Decreased range of motion, Decreased endurance, Decreased activity tolerance, Difficulty walking  Visit Diagnosis: Chronic right-sided low back pain with right-sided sciatica  Muscle weakness (generalized)  Joint stiffness of spine     Problem List Patient Active Problem List   Diagnosis Date Noted  . Acute right-sided low back pain without sciatica 02/07/2018  . Injury of right ankle 08/13/2017  . Left lower lobe pneumonia (HCC) 06/20/2017  . Skin rash 06/06/2017  . Syncope 05/17/2017  . Near syncope 02/21/2017  . Kidney stones 11/22/2016  . Healthcare maintenance 02/13/2013  . Smoker   . IBS (irritable bowel syndrome)    Cammie Mcgee, PT, DPT # [phone] 05/24/2018, 8:55  AM  [facility] [address] [place], [place], [address] Phone: [phone]   Fax:  [phone]  Name: Jacqueline [name] MRN: [id number] Date of Birth: Feb 13, 1986

## 2018-05-24 ENCOUNTER — Ambulatory Visit: Payer: 59 | Admitting: Physical Therapy

## 2018-05-24 ENCOUNTER — Encounter: Payer: Self-pay | Admitting: Physical Therapy

## 2018-05-24 DIAGNOSIS — M6281 Muscle weakness (generalized): Secondary | ICD-10-CM

## 2018-05-24 DIAGNOSIS — G8929 Other chronic pain: Secondary | ICD-10-CM

## 2018-05-24 DIAGNOSIS — M5441 Lumbago with sciatica, right side: Principal | ICD-10-CM

## 2018-05-24 DIAGNOSIS — M2560 Stiffness of unspecified joint, not elsewhere classified: Secondary | ICD-10-CM

## 2018-05-24 NOTE — Therapy (Signed)
[facility] [address] [place], [place], [address] Phone: [phone]   Fax:  [phone]  Physical Therapy Treatment  Patient Details  Name: Jacqueline [name] MRN: [id number] Date of Birth: 21-Jul-1986 Referring Provider: Colan Neptune, MD   Encounter Date: 05/24/2018  PT End of Session - 05/24/18 0904    Visit Number  4    Number of Visits  8    Date for PT Re-Evaluation  06/11/18    PT Start Time  0733    PT Stop Time  0831    PT Time Calculation (min)  58 min    Activity Tolerance  Patient tolerated treatment well;Patient limited by pain    Behavior During Therapy  [facility] for tasks assessed/performed       Past Medical History:  Diagnosis Date  . IBS (irritable bowel syndrome)    h/o this  . Smoker     Past Surgical History:  Procedure Laterality Date  . CESAREAN SECTION  2012  . CHOLECYSTECTOMY  2005  . COLONOSCOPY  2006   WNL per pt  . ROTATOR CUFF REPAIR  2004   right  . SHOULDER SURGERY  2012   bone spurs; right    There were no vitals filed for this visit.  Subjective Assessment - 05/24/18 0900    Subjective  Pt. reports no new complaints.  Pt. states she is stiff in low back this morning and had some low back discomfort/aching last night.      Pertinent History  Pt. has benefitted from use of Prednisone and has received 1 injection a few weeks ago.  Pt. returns to MD 06/12/18.  Pt. has not tried back brace.      Limitations  Sitting;Standing;Walking;House hold activities    Patient Stated Goals  Decrease LBP/ return to work (mid-August).      Currently in Pain?  No/denies         Treatment:  Therex.:   Standing Nautilus: core ex. With UE 40# 5x L/R 3x each. Resisted gait (Nautilus): 80# forward/backwards and 60# lateral L/R 5x each. See new HEP (prone planking/ side planking/ quadruped). Wall squats with ball 20x.    Manual tx.:  Supine LE/lumbar stretches (12 min.) No STM or mobs  today.    PT Education - 05/24/18 0904    Education provided  Yes    Education Details  Prone/side planking issued.      Person(s) Educated  Patient;Spouse    Methods  Explanation;Demonstration;Handout    Comprehension  Verbalized understanding;Returned demonstration          PT Long Term Goals - 05/18/18 1429      PT LONG TERM GOAL #1   Title  Pt. independent with HEP to increase lumbar AROM 50% all planes of movement with no increase c/o pain to improve daily tasks.     Baseline  Pt. presents decrease lumbar AROM: flexion (50% limited), extension 100% limited (neutral)- pain limited, R rotn. (75% limited)- pain/ L rotn. (25% limited).     Time  4    Period  Weeks    Status  New    Target Date  06/11/18      PT LONG TERM GOAL #2   Title  Pt. will increase core/LE muscle strength 1/2 muscle grade to improve pain-free mobility.      Baseline  B LE muscle strength grossly 4+/5 MMT except hip flexion 4/5 MMT.    Time  4  Period  Weeks    Status  New    Target Date  06/11/18      PT LONG TERM GOAL #3   Title  Pt. will demonstrate proper body/ lifting mechanics with no c/o low back pain.      Baseline  increase pain with lifting/ limited lumbar ROM.      Time  4    Period  Weeks    Status  New    Target Date  06/11/18      PT LONG TERM GOAL #4   Title  Pt. will increase FOTO from 46 to 66 to improve pain-fre mobility with daily tasks.      Baseline  FOTO baseline on 5/21: 46.     Time  4    Period  Weeks    Status  New    Target Date  06/11/18            Plan - 05/24/18 0905    Clinical Impression Statement  Pt. progressing well with higher level stability ex. and PT issued prone and sidelying planks.  Lumbar extension remains limited but pt. is able to tolerate increase range/ hold times.  Good technique with core ex. program.     Clinical Presentation  Evolving    Clinical Decision Making  Moderate    Rehab Potential  Good    PT Frequency  2x / week     PT Duration  4 weeks    PT Treatment/Interventions  ADLs/Self Care Home Management;Aquatic Therapy;Moist Heat;Electrical Stimulation;Cryotherapy;Traction;Ultrasound;Therapeutic activities;Functional mobility training;Stair training;Gait training;Therapeutic exercise;Balance training;Neuromuscular re-education;Patient/family education;Manual techniques;Passive range of motion;Dry needling    PT Next Visit Plan  Progress Core stability/ lumbar extension.  Reassess TENS use/ Inversion table.      PT Home Exercise Plan  see handouts       Patient will benefit from skilled therapeutic intervention in order to improve the following deficits and impairments:  Pain, Improper body mechanics, Hypomobility, Decreased strength, Decreased range of motion, Decreased endurance, Decreased activity tolerance, Difficulty walking  Visit Diagnosis: Chronic right-sided low back pain with right-sided sciatica  Muscle weakness (generalized)  Joint stiffness of spine     Problem List Patient Active Problem List   Diagnosis Date Noted  . Acute right-sided low back pain without sciatica 02/07/2018  . Injury of right ankle 08/13/2017  . Left lower lobe pneumonia (HCC) 06/20/2017  . Skin rash 06/06/2017  . Syncope 05/17/2017  . Near syncope 02/21/2017  . Kidney stones 11/22/2016  . Healthcare maintenance 02/13/2013  . Smoker   . IBS (irritable bowel syndrome)    Cammie Mcgee, PT, DPT # [phone] 05/24/2018, 9:22 AM  [facility] [address] [place], [place], [address] Phone: [phone]   Fax:  [phone]  Name: Jacqueline [name] MRN: [id number] Date of Birth: 02/20/1986

## 2018-05-28 ENCOUNTER — Ambulatory Visit: Payer: 59 | Attending: Orthopedic Surgery | Admitting: Physical Therapy

## 2018-05-28 ENCOUNTER — Encounter: Payer: Self-pay | Admitting: Physical Therapy

## 2018-05-28 DIAGNOSIS — M2560 Stiffness of unspecified joint, not elsewhere classified: Secondary | ICD-10-CM

## 2018-05-28 DIAGNOSIS — G8929 Other chronic pain: Secondary | ICD-10-CM | POA: Insufficient documentation

## 2018-05-28 DIAGNOSIS — M5441 Lumbago with sciatica, right side: Secondary | ICD-10-CM | POA: Insufficient documentation

## 2018-05-28 DIAGNOSIS — M6281 Muscle weakness (generalized): Secondary | ICD-10-CM | POA: Insufficient documentation

## 2018-05-29 NOTE — Therapy (Signed)
[facility] [address] [place], [place], [address] Phone: [phone]   Fax:  [phone]  Physical Therapy Treatment  Patient Details  Name: Jacqueline [name] MRN: [id number] Date of Birth: 07/30/1986 Referring Provider: Colan NeptuneSameer Mathur, MD   Encounter Date: 05/28/2018  PT End of Session - 05/28/18 0745    Visit Number  5    Number of Visits  8    Date for PT Re-Evaluation  06/11/18    PT Start Time  0733    PT Stop Time  0829    PT Time Calculation (min)  56 min    Activity Tolerance  Patient tolerated treatment well;Patient limited by pain    Behavior During Therapy  [facility] for tasks assessed/performed       Past Medical History:  Diagnosis Date  . IBS (irritable bowel syndrome)    h/o this  . Smoker     Past Surgical History:  Procedure Laterality Date  . CESAREAN SECTION  2012  . CHOLECYSTECTOMY  2005  . COLONOSCOPY  2006   WNL per pt  . ROTATOR CUFF REPAIR  2004   right  . SHOULDER SURGERY  2012   bone spurs; right    There were no vitals filed for this visit.  Subjective Assessment - 05/28/18 0732    Subjective  Pt. reports increase B quad muscle soreness after last tx.   No increase c/o back pain.  Pt. really wanting to get back to work.      Pertinent History  Pt. has benefitted from use of Prednisone and has received 1 injection a few weeks ago.  Pt. returns to MD 06/12/18.  Pt. has not tried back brace.      Limitations  Sitting;Standing;Walking;House hold activities    Patient Stated Goals  Decrease LBP/ return to work (mid-August).      Currently in Pain?  Yes    Pain Score  1     Pain Location  Back    Pain Orientation  Lower;Right    Pain Descriptors / Indicators  Aching    Pain Type  Chronic pain        Treatment:  Therex.:   TM walking 2.2 mph with 0-10% incline 10 min.   Prone hip extension 10x L/R and quadruped hip extension 10x L/R.  L/R clamshells with manual resistance 20x. Standing  Nautilus: core ex. With UE 40# 5x L/R 3x each. Nautilus: lat. Pull downs 40#/ tricep extension 30# 30x each. TG knee flexion (midline/ toe in/ out) 20x each.      Manual tx.:  Supine LE/lumbar stretches (8 min.) Prone STM to lumbar paraspinals/ Grade II-III PA mobs. To low thoracic/upper lumbar (central/unilateral)- 2x20 sec. Each.      PT Long Term Goals - 05/18/18 1429      PT LONG TERM GOAL #1   Title  Pt. independent with HEP to increase lumbar AROM 50% all planes of movement with no increase c/o pain to improve daily tasks.     Baseline  Pt. presents decrease lumbar AROM: flexion (50% limited), extension 100% limited (neutral)- pain limited, R rotn. (75% limited)- pain/ L rotn. (25% limited).     Time  4    Period  Weeks    Status  New    Target Date  06/11/18      PT LONG TERM GOAL #2   Title  Pt. will increase core/LE muscle strength 1/2 muscle grade to improve pain-free mobility.  Baseline  B LE muscle strength grossly 4+/5 MMT except hip flexion 4/5 MMT.    Time  4    Period  Weeks    Status  New    Target Date  06/11/18      PT LONG TERM GOAL #3   Title  Pt. will demonstrate proper body/ lifting mechanics with no c/o low back pain.      Baseline  increase pain with lifting/ limited lumbar ROM.      Time  4    Period  Weeks    Status  New    Target Date  06/11/18      PT LONG TERM GOAL #4   Title  Pt. will increase FOTO from 46 to 66 to improve pain-fre mobility with daily tasks.      Baseline  FOTO baseline on 5/21: 46.     Time  4    Period  Weeks    Status  New    Target Date  06/11/18            Plan - 05/29/18 0704    Clinical Impression Statement  No increase c/o back pain during tx. session.  Frequent cuing t/o tx. session to maintain core muscle activation during ther.ex.  No change to HEP at this time.  Pt. returns to MD in 2 weeks with PT focus on core activation/ increasing lumbar ROM.      Clinical Presentation  Evolving    Clinical  Decision Making  Moderate    Rehab Potential  Good    PT Frequency  2x / week    PT Duration  4 weeks    PT Treatment/Interventions  ADLs/Self Care Home Management;Aquatic Therapy;Moist Heat;Electrical Stimulation;Cryotherapy;Traction;Ultrasound;Therapeutic activities;Functional mobility training;Stair training;Gait training;Therapeutic exercise;Balance training;Neuromuscular re-education;Patient/family education;Manual techniques;Passive range of motion;Dry needling    PT Next Visit Plan  Progress Core stability/ lumbar extension.  Reassess TENS use/ Inversion table.      PT Home Exercise Plan  see handouts       Patient will benefit from skilled therapeutic intervention in order to improve the following deficits and impairments:  Pain, Improper body mechanics, Hypomobility, Decreased strength, Decreased range of motion, Decreased endurance, Decreased activity tolerance, Difficulty walking  Visit Diagnosis: Chronic right-sided low back pain with right-sided sciatica  Muscle weakness (generalized)  Joint stiffness of spine     Problem List Patient Active Problem List   Diagnosis Date Noted  . Acute right-sided low back pain without sciatica 02/07/2018  . Injury of right ankle 08/13/2017  . Left lower lobe pneumonia (HCC) 06/20/2017  . Skin rash 06/06/2017  . Syncope 05/17/2017  . Near syncope 02/21/2017  . Kidney stones 11/22/2016  . Healthcare maintenance 02/13/2013  . Smoker   . IBS (irritable bowel syndrome)    Cammie Mcgee, PT, DPT # [phone] 05/29/2018, 7:06 AM  [facility] [address] [place], [place], [address] Phone: [phone]   Fax:  [phone]  Name: Jacqueline [name] MRN: [id number] Date of Birth: May 21, 1986

## 2018-05-30 ENCOUNTER — Encounter: Payer: Self-pay | Admitting: Physical Therapy

## 2018-05-30 ENCOUNTER — Ambulatory Visit: Payer: 59 | Admitting: Physical Therapy

## 2018-05-30 DIAGNOSIS — G8929 Other chronic pain: Secondary | ICD-10-CM

## 2018-05-30 DIAGNOSIS — M5441 Lumbago with sciatica, right side: Principal | ICD-10-CM

## 2018-05-30 DIAGNOSIS — M6281 Muscle weakness (generalized): Secondary | ICD-10-CM

## 2018-05-30 DIAGNOSIS — M2560 Stiffness of unspecified joint, not elsewhere classified: Secondary | ICD-10-CM

## 2018-05-30 NOTE — Therapy (Addendum)
[facility] [address]. [place], [place], [address] Phone: [phone]   Fax:  [phone]  Physical Therapy Treatment  Patient Details  Name: Lamisha Roussell MRN: [id number] Date of Birth: 21-Mar-1986 Referring Provider: Colan Neptune, MD   Encounter Date: 05/30/2018    Treatment: 6 of 8.  Recert date: 06/11/18 0729 to 0830   Past Medical History:  Diagnosis Date  . IBS (irritable bowel syndrome)    h/o this  . Smoker     Past Surgical History:  Procedure Laterality Date  . CESAREAN SECTION  2012  . CHOLECYSTECTOMY  2005  . COLONOSCOPY  2006   WNL per pt  . ROTATOR CUFF REPAIR  2004   right  . SHOULDER SURGERY  2012   bone spurs; right    There were no vitals filed for this visit.    Pt. reports 2-3/10 low back pain. Pt. tries to demonstrate standing lumbar extension (limited)- increase pain.         Treatment:    IFC to low back at 40% modulation setting as tolerated.  Instructed in trial TENS unit.  Used t/o tx. Session.     Therex.:  TM walking 2.2 mph with 0-10% incline 10 min.  Floor to waist box (10#) 5x.  Supine bridging 10x2 (modified) Prone hip extension 10x L/R and quadruped hip extension 10x L/R.  L/R clamshells with manual resistance 20x. Standing Nautilus: core ex. With UE 40# 5x L/R 3x each. Nautilus: lat. Pull downs 40#/ tricep extension 30# 30x each. TG knee flexion (midline/ toe in/ out) 20x each.    Manual tx.:  Supine LE/lumbar stretches ( .) Prone STM to lumbar paraspinals/ Grade II-III PA mobs. To low thoracic/upper lumbar (central/unilateral)- 2x20 sec. Each.          Pt. instructed in IFC TENS unit for pain mgmt. while completing HEP/ household tasks. Pt. will use trial TENS unit at home and understands has to properly manage. Pt. remains limited lumbar extension but making progress.     PT Long Term Goals - 05/18/18 1429      PT LONG TERM GOAL #1   Title   Pt. independent with HEP to increase lumbar AROM 50% all planes of movement with no increase c/o pain to improve daily tasks.     Baseline  Pt. presents decrease lumbar AROM: flexion (50% limited), extension 100% limited (neutral)- pain limited, R rotn. (75% limited)- pain/ L rotn. (25% limited).     Time  4    Period  Weeks    Status  New    Target Date  06/11/18      PT LONG TERM GOAL #2   Title  Pt. will increase core/LE muscle strength 1/2 muscle grade to improve pain-free mobility.      Baseline  B LE muscle strength grossly 4+/5 MMT except hip flexion 4/5 MMT.    Time  4    Period  Weeks    Status  New    Target Date  06/11/18      PT LONG TERM GOAL #3   Title  Pt. will demonstrate proper body/ lifting mechanics with no c/o low back pain.      Baseline  increase pain with lifting/ limited lumbar ROM.      Time  4    Period  Weeks    Status  New    Target Date  06/11/18      PT LONG TERM GOAL #4  Title  Pt. will increase FOTO from 46 to 66 to improve pain-fre mobility with daily tasks.      Baseline  FOTO baseline on 5/21: 46.     Time  4    Period  Weeks    Status  New    Target Date  06/11/18              Patient will benefit from skilled therapeutic intervention in order to improve the following deficits and impairments:  Pain, Improper body mechanics, Hypomobility, Decreased strength, Decreased range of motion, Decreased endurance, Decreased activity tolerance, Difficulty walking  Visit Diagnosis: Chronic right-sided low back pain with right-sided sciatica  Muscle weakness (generalized)  Joint stiffness of spine     Problem List Patient Active Problem List   Diagnosis Date Noted  . Acute right-sided low back pain without sciatica 02/07/2018  . Injury of right ankle 08/13/2017  . Left lower lobe pneumonia (HCC) 06/20/2017  . Skin rash 06/06/2017  . Syncope 05/17/2017  . Near syncope 02/21/2017  . Kidney stones 11/22/2016  . Healthcare maintenance  02/13/2013  . Smoker   . IBS (irritable bowel syndrome)    Cammie McgeeMichael C Fahmida Jurich, PT, DPT # [id number] 05/31/2018, 5:20 PM  [facility] [address] [place], [place], [address] Phone: [phone]   Fax:  [phone]  Name: Ernest PineBeth Torry MRN: [id number] Date of Birth: September 23, 1986

## 2018-06-04 ENCOUNTER — Ambulatory Visit: Payer: 59 | Admitting: Physical Therapy

## 2018-06-04 ENCOUNTER — Encounter: Payer: 59 | Admitting: Physical Therapy

## 2018-06-04 DIAGNOSIS — M2560 Stiffness of unspecified joint, not elsewhere classified: Secondary | ICD-10-CM

## 2018-06-04 DIAGNOSIS — M5441 Lumbago with sciatica, right side: Principal | ICD-10-CM

## 2018-06-04 DIAGNOSIS — G8929 Other chronic pain: Secondary | ICD-10-CM

## 2018-06-04 DIAGNOSIS — M6281 Muscle weakness (generalized): Secondary | ICD-10-CM

## 2018-06-06 ENCOUNTER — Ambulatory Visit: Payer: 59 | Admitting: Physical Therapy

## 2018-06-06 ENCOUNTER — Encounter: Payer: Self-pay | Admitting: Physical Therapy

## 2018-06-06 DIAGNOSIS — M5441 Lumbago with sciatica, right side: Principal | ICD-10-CM

## 2018-06-06 DIAGNOSIS — M6281 Muscle weakness (generalized): Secondary | ICD-10-CM

## 2018-06-06 DIAGNOSIS — G8929 Other chronic pain: Secondary | ICD-10-CM

## 2018-06-06 DIAGNOSIS — M2560 Stiffness of unspecified joint, not elsewhere classified: Secondary | ICD-10-CM

## 2018-06-06 NOTE — Therapy (Addendum)
[facility] [address] [place], [place], [address] Phone: [phone]   Fax:  [phone]  Physical Therapy Treatment  Patient Details  Name: Jacqueline [name] MRN: [id number] Date of Birth: 1986-08-03 Referring Provider: Colan Neptune, MD   Encounter Date: 06/06/2018   Treatment: 8 of 8.  Recert date: 06/11/18 0734 to 0830   Past Medical History:  Diagnosis Date  . IBS (irritable bowel syndrome)    h/o this  . Smoker     Past Surgical History:  Procedure Laterality Date  . CESAREAN SECTION  2012  . CHOLECYSTECTOMY  2005  . COLONOSCOPY  2006   WNL per pt  . ROTATOR CUFF REPAIR  2004   right  . SHOULDER SURGERY  2012   bone spurs; right    There were no vitals filed for this visit.    Subjective Assessment - 06/06/18 0732    Subjective  Pt. reports 1.25 LBP at this time.      Pertinent History  Pt. has benefitted from use of Prednisone and has received 1 injection a few weeks ago.  Pt. returns to MD 06/12/18.  Pt. has not tried back brace.      Limitations  Sitting;Standing;Walking;House hold activities    Patient Stated Goals  Decrease LBP/ return to work (mid-August).           Treatment:  There.ex.:   100 stairs with recip. Pattern (no issues) TG knee flexion 20x each (midline/ toe in/ toe out).  Heel raises/ gastroc stretches.   Resisted gait (Nautilus): 110# forward/backwards.  95# lateral L/R.   Seated gray ball: GTB scap. Retraction/ tricep ext./ diagonals/ 5# bicep curls/ 5# press-ups/ lateral pelvic tilts 30x each  Manual tx.:   Supine LE/lumbar generalized stretches  Prone hip extension/ rotn. Stretches.   Prone grade II-III PA mobs. (central/unilateral).       PT Long Term Goals - 05/18/18 1429      PT LONG TERM GOAL #1   Title  Pt. independent with HEP to increase lumbar AROM 50% all planes of movement with no increase c/o pain to improve daily tasks.     Baseline  Pt. presents decrease  lumbar AROM: flexion (50% limited), extension 100% limited (neutral)- pain limited, R rotn. (75% limited)- pain/ L rotn. (25% limited).     Time  4    Period  Weeks    Status  New    Target Date  06/11/18      PT LONG TERM GOAL #2   Title  Pt. will increase core/LE muscle strength 1/2 muscle grade to improve pain-free mobility.      Baseline  B LE muscle strength grossly 4+/5 MMT except hip flexion 4/5 MMT.    Time  4    Period  Weeks    Status  New    Target Date  06/11/18      PT LONG TERM GOAL #3   Title  Pt. will demonstrate proper body/ lifting mechanics with no c/o low back pain.      Baseline  increase pain with lifting/ limited lumbar ROM.      Time  4    Period  Weeks    Status  New    Target Date  06/11/18      PT LONG TERM GOAL #4   Title  Pt. will increase FOTO from 46 to 66 to improve pain-fre mobility with daily tasks.      Baseline  FOTO baseline on 5/21: 46.     Time  4    Period  Weeks    Status  New    Target Date  06/11/18          Patient will benefit from skilled therapeutic intervention in order to improve the following deficits and impairments:  Pain, Improper body mechanics, Hypomobility, Decreased strength, Decreased range of motion, Decreased endurance, Decreased activity tolerance, Difficulty walking  Visit Diagnosis: Chronic right-sided low back pain with right-sided sciatica  Muscle weakness (generalized)  Joint stiffness of spine     Problem List Patient Active Problem List   Diagnosis Date Noted  . Acute right-sided low back pain without sciatica 02/07/2018  . Injury of right ankle 08/13/2017  . Left lower lobe pneumonia (HCC) 06/20/2017  . Skin rash 06/06/2017  . Syncope 05/17/2017  . Near syncope 02/21/2017  . Kidney stones 11/22/2016  . Healthcare maintenance 02/13/2013  . Smoker   . IBS (irritable bowel syndrome)    Cammie McgeeMichael C Elizzie Westergard, PT, DPT # [id number] 06/10/2018, 5:19 PM  [facility] [address] [place], [place], [address] Phone: [phone]   Fax:  [phone]  Name: Jacqueline PineBeth [name] MRN: [id number] Date of Birth: 02/22/1986

## 2018-06-07 ENCOUNTER — Encounter: Payer: Self-pay | Admitting: Physical Therapy

## 2018-06-07 NOTE — Therapy (Addendum)
[facility] [address] [place], [place], [address] Phone: [phone]   Fax:  [phone]  Physical Therapy Treatment  Patient Details  Name: Ernest PineBeth Brule MRN: [id number] Date of Birth: August 05, 1986 Referring Provider: Colan NeptuneSameer Mathur, MD   Encounter Date: 06/04/2018    Treatment: 7 of 8.  Recert date: 06/11/18 1103 to 1201   Past Medical History:  Diagnosis Date  . IBS (irritable bowel syndrome)    h/o this  . Smoker     Past Surgical History:  Procedure Laterality Date  . CESAREAN SECTION  2012  . CHOLECYSTECTOMY  2005  . COLONOSCOPY  2006   WNL per pt  . ROTATOR CUFF REPAIR  2004   right  . SHOULDER SURGERY  2012   bone spurs; right    There were no vitals filed for this visit.  Subjective Assessment - 06/06/18 0732    Subjective   Pt. reports <1/10 pain in back and primary c/o stiffness. Pt. states she is working core stability ex. at home as well as lumbar mobility. Pt. occasionally using TENS unit.          Pertinent History  Pt. has benefitted from use of Prednisone and has received 1 injection a few weeks ago.  Pt. returns to MD 06/12/18.  Pt. has not tried back brace.      Limitations  Sitting;Standing;Walking;House hold activities    Patient Stated Goals  Decrease LBP/ return to work (mid-August).           Treatment:  Therex.:  TM walking 2.2 mph with 0-10% incline 10 min.   Prone hip extension 10x L/R and quadruped hip extension 10x L/R.  L/R clamshells with manual resistance 20x. Nautilus: lat. Pull downs 40#/ tricep extension 30# 30x each. TG knee flexion (midline/ toe in/ out) 20x each 70# sled push/pull in hallway 3x 45 feet each. 30# box lift/ carry 1/2 bolster balance task in //-bars (no UE assist goal).    Manual tx.:  Supine LE/lumbar stretches (8min.) Prone STM to lumbar paraspinals/ Grade II-III PA mobs. To low thoracic/upper lumbar (central/unilateral)- 2x20 sec. Each.       PT Long Term Goals - 05/18/18 1429      PT LONG TERM GOAL #1   Title  Pt. independent with HEP to increase lumbar AROM 50% all planes of movement with no increase c/o pain to improve daily tasks.     Baseline  Pt. presents decrease lumbar AROM: flexion (50% limited), extension 100% limited (neutral)- pain limited, R rotn. (75% limited)- pain/ L rotn. (25% limited).     Time  4    Period  Weeks    Status  New    Target Date  06/11/18      PT LONG TERM GOAL #2   Title  Pt. will increase core/LE muscle strength 1/2 muscle grade to improve pain-free mobility.      Baseline  B LE muscle strength grossly 4+/5 MMT except hip flexion 4/5 MMT.    Time  4    Period  Weeks    Status  New    Target Date  06/11/18      PT LONG TERM GOAL #3   Title  Pt. will demonstrate proper body/ lifting mechanics with no c/o low back pain.      Baseline  increase pain with lifting/ limited lumbar ROM.      Time  4    Period  Weeks  Status  New    Target Date  06/11/18      PT LONG TERM GOAL #4   Title  Pt. will increase FOTO from 46 to 66 to improve pain-fre mobility with daily tasks.      Baseline  FOTO baseline on 5/21: 46.     Time  4    Period  Weeks    Status  New    Target Date  06/11/18          Plan - 06/06/18 0836    Clinical Impression Statement Marked increase in thoracic/lumbar extension after stretche/ manual tx. and physioball ex. Pt. able to tolerate supine lumbar extension on ball with less increase c/o pain. Pt. continues has lumar hypomobility/ tenderness with manual tx. and mobs.    Clinical Presentation  Evolving    Clinical Decision Making  Moderate    Rehab Potential  Good    PT Frequency  2x / week    PT Duration  4 weeks    PT Treatment/Interventions  ADLs/Self Care Home Management;Aquatic Therapy;Moist Heat;Electrical Stimulation;Cryotherapy;Traction;Ultrasound;Therapeutic activities;Functional mobility training;Stair training;Gait training;Therapeutic  exercise;Balance training;Neuromuscular re-education;Patient/family education;Manual techniques;Passive range of motion;Dry needling    PT Next Visit Plan Progress core stability/ recert and check goals next visit     PT Home Exercise Plan  see handouts       Patient will benefit from skilled therapeutic intervention in order to improve the following deficits and impairments:     Visit Diagnosis: Chronic right-sided low back pain with right-sided sciatica  Muscle weakness (generalized)  Joint stiffness of spine     Problem List Patient Active Problem List   Diagnosis Date Noted  . Acute right-sided low back pain without sciatica 02/07/2018  . Injury of right ankle 08/13/2017  . Left lower lobe pneumonia (HCC) 06/20/2017  . Skin rash 06/06/2017  . Syncope 05/17/2017  . Near syncope 02/21/2017  . Kidney stones 11/22/2016  . Healthcare maintenance 02/13/2013  . Smoker   . IBS (irritable bowel syndrome)    Cammie Mcgee, PT, DPT # [phone] 06/07/2018, 9:56 AM  [facility] [address] [place], [place], [address] Phone: [phone]   Fax:  [phone]  Name: Syble Picco MRN: [id number] Date of Birth: 05-Apr-1986

## 2018-06-11 ENCOUNTER — Ambulatory Visit: Payer: 59 | Admitting: Physical Therapy

## 2018-06-11 ENCOUNTER — Encounter: Payer: Self-pay | Admitting: Physical Therapy

## 2018-06-11 ENCOUNTER — Encounter: Payer: 59 | Admitting: Physical Therapy

## 2018-06-11 DIAGNOSIS — M5441 Lumbago with sciatica, right side: Secondary | ICD-10-CM | POA: Diagnosis not present

## 2018-06-11 DIAGNOSIS — M6281 Muscle weakness (generalized): Secondary | ICD-10-CM

## 2018-06-11 DIAGNOSIS — M2560 Stiffness of unspecified joint, not elsewhere classified: Secondary | ICD-10-CM

## 2018-06-11 DIAGNOSIS — G8929 Other chronic pain: Secondary | ICD-10-CM

## 2018-06-11 NOTE — Therapy (Signed)
[facility] [address] [place], [place], [address] Phone: [phone]   Fax:  [phone]  Physical Therapy Treatment  Patient Details  Name: Jacqueline [name] MRN: [id number] Date of Birth: 02-03-1986 Referring Provider: Meda Coffee, MD   Encounter Date: 06/11/2018    Treatment 9 of 17.  Recert date: 06/26/49   Past Medical History:  Diagnosis Date  . IBS (irritable bowel syndrome)    h/o this  . Smoker     Past Surgical History:  Procedure Laterality Date  . CESAREAN SECTION  2012  . CHOLECYSTECTOMY  2005  . COLONOSCOPY  2006   WNL per pt  . ROTATOR CUFF REPAIR  2004   right  . SHOULDER SURGERY  2012   bone spurs; right    There were no vitals filed for this visit.     Pt. reports 0.825 LBP upon arrival to PT clinic. Pt. reports to have assisted in loading items for their camping trip.       Treatment:  There.ex.:   TG knee flexion 20x each (midline/ toe in/ toe out).  Heel raises/ gastroc stretches.   Nautilus: 50# core stabilization walk-outs Seated gray ball:  GTB scap./ diagonals/ lateral pelvic tilts 30x each  10lb. Weight with thoracic rotations and chest press Supine Dead Bug 4x30sec. Supine Bridging x10      Manual tx.:   Supine LE/lumbar generalized stretches       [facility] PT Assessment - 06/12/18 0001      Assessment   Medical Diagnosis  Radiculopathy, lumbar region/ Spondylosis without myelopathy/ Low back pain    Referring Provider  Meda Coffee, MD    Onset Date/Surgical Date  01/25/18      Prior Function   Level of Independence  Independent         PT Long Term Goals - 06/12/18 1739      PT LONG TERM GOAL #1   Title  Pt. independent with HEP to increase lumbar AROM 50% all planes of movement with no increase c/o pain to improve daily tasks.     Baseline  Decrease extension (75% limited).  Increase R rotn. noted.     Time  4    Period  Weeks    Status  Partially Met     Target Date  07/09/18      PT LONG TERM GOAL #2   Title  Pt. will increase core/LE muscle strength 1/2 muscle grade to improve pain-free mobility.      Baseline  B LE muscle strength grossly 4+/5 MMT except hip flexion 4/5 MMT.  Progresing core stability program.      Time  4    Period  Weeks    Status  Partially Met    Target Date  07/09/18      PT LONG TERM GOAL #3   Title  Pt. will demonstrate proper body/ lifting mechanics with no c/o low back pain.      Baseline  Good mechanics    Period  Weeks    Status  Achieved    Target Date  06/11/18      PT LONG TERM GOAL #4   Title  Pt. will increase FOTO from 46 to 66 to improve pain-fre mobility with daily tasks.      Baseline  FOTO baseline on 5/21: 46.   6/18: 72    Time  4    Period  Weeks    Status  Achieved  Target Date  06/11/18      PT LONG TERM GOAL #5   Title  Pt. able to return to work with no limitations or restrictions with lifting.      Baseline  pt. remains out of work at this time    Time  4    Period  Weeks    Status  New    Target Date  07/09/18            Plan - 06/11/18 1417    Clinical Impression Statement  Pt. is progressing well and tolerating progressive core exercises without any increase in pain.  Marked increase in FOTO score: baseline 46/ today 72/ goal 66.  Pt. remains limited with lumbar extension AROM in all positions but increase core stability with floor to waist lifting/ carrying tasks.  Less overall c/o low back pain with daily activities.  Pt. progressing towards all PT goals and will continue to increase weight demands needed for return to work.    Clinical Presentation  Evolving    Clinical Decision Making  Moderate    Rehab Potential  Good    PT Frequency  2x / week    PT Duration  4 weeks    PT Treatment/Interventions  ADLs/Self Care Home Management;Aquatic Therapy;Moist Heat;Electrical Stimulation;Cryotherapy;Traction;Ultrasound;Therapeutic activities;Functional mobility  training;Stair training;Gait training;Therapeutic exercise;Balance training;Neuromuscular re-education;Patient/family education;Manual techniques;Passive range of motion;Dry needling    PT Next Visit Plan  Progress Core stability/ lumbar extension/ LE stretching.  DISCUSS MD F/U VISIT    PT Home Exercise Plan  see handouts       Patient will benefit from skilled therapeutic intervention in order to improve the following deficits and impairments:  Pain, Improper body mechanics, Hypomobility, Decreased strength, Decreased range of motion, Decreased endurance, Decreased activity tolerance, Difficulty walking  Visit Diagnosis: Chronic right-sided low back pain with right-sided sciatica  Muscle weakness (generalized)  Joint stiffness of spine     Problem List Patient Active Problem List   Diagnosis Date Noted  . Acute right-sided low back pain without sciatica 02/07/2018  . Injury of right ankle 08/13/2017  . Left lower lobe pneumonia ([place]) 06/20/2017  . Skin rash 06/06/2017  . Syncope 05/17/2017  . Near syncope 02/21/2017  . Kidney stones 11/22/2016  . Healthcare maintenance 02/13/2013  . Smoker   . IBS (irritable bowel syndrome)    Pura Spice, PT, DPT # [id number] [place], SPT 06/12/2018, 5:48 PM  [facility] [address] [place], [place], [address] Phone: [phone]   Fax:  [phone]  Name: Jacqueline [name] MRN: [id number] Date of Birth: 19-Jul-1986

## 2018-06-13 ENCOUNTER — Encounter: Payer: Self-pay | Admitting: Physical Therapy

## 2018-06-13 ENCOUNTER — Ambulatory Visit: Payer: 59

## 2018-06-13 ENCOUNTER — Encounter: Payer: 59 | Admitting: Physical Therapy

## 2018-06-13 DIAGNOSIS — M5441 Lumbago with sciatica, right side: Principal | ICD-10-CM

## 2018-06-13 DIAGNOSIS — M6281 Muscle weakness (generalized): Secondary | ICD-10-CM

## 2018-06-13 DIAGNOSIS — G8929 Other chronic pain: Secondary | ICD-10-CM

## 2018-06-13 DIAGNOSIS — M2560 Stiffness of unspecified joint, not elsewhere classified: Secondary | ICD-10-CM

## 2018-06-13 NOTE — Therapy (Signed)
[facility] [address] [place], [place], [address] Phone: [phone]   Fax:  [phone]  Physical Therapy Treatment  Patient Details  Name: Jacqueline [name] MRN: [id number] Date of Birth: July 26, 1986 Referring Provider: Meda Coffee, MD   Encounter Date: 06/13/2018  PT End of Session - 06/13/18 1342    Visit Number  10    Number of Visits  17    Date for PT Re-Evaluation  07/09/18    PT Start Time  0943    PT Stop Time  1047    PT Time Calculation (min)  64 min    Activity Tolerance  Patient tolerated treatment well;Patient limited by pain    Behavior During Therapy  [facility] for tasks assessed/performed       Past Medical History:  Diagnosis Date  . IBS (irritable bowel syndrome)    h/o this  . Smoker     Past Surgical History:  Procedure Laterality Date  . CESAREAN SECTION  2012  . CHOLECYSTECTOMY  2005  . COLONOSCOPY  2006   WNL per pt  . ROTATOR CUFF REPAIR  2004   right  . SHOULDER SURGERY  2012   bone spurs; right    There were no vitals filed for this visit.  Subjective Assessment - 06/13/18 0951    Subjective  Pt. reports no pain in LBP upon entering the clinic.  Pt. reports to have drived a lot yesterday but had no pain even though she normally reports having back pain with longer drives.     Pertinent History  Pt. has benefitted from use of Prednisone and has received 1 injection a few weeks ago.  Pt. returns to MD 06/12/18.  Pt. has not tried back brace.      Limitations  Sitting;Standing;Walking;House hold activities    Patient Stated Goals  Decrease LBP/ return to work (mid-August).      Currently in Pain?  No/denies    Pain Score  0-No pain    Pain Location  Back    Pain Orientation  Lower;Right       Treatment:  There Ex:  TG knee flexion 20x each (midline/ toe in/ toe out) 2x20 each / Heel Raises/Gastroc stretches 2x25 Supine bridges with 10lb 2x20 Supine Trunk Rotation 2x25 Supine Rhythmic  Stabilization w/ 10# weight 2x30sec. Supine Isometric crunches with green yoga ball 2x20 Supine Isometric transverse crunches with green yoga ball 2x20 Supine Deadbugs 2x20 B Birddogs on mat 2x20 B  Manual:  Supine LE/lumbar generalized stretches  Prone STM to Lumbar region Prone Central PA's to Lumber region       PT Education - 06/13/18 1341    Education provided  Yes    Education Details  Pt. educated on HEP and progressing of core activities outside of clinic    Person(s) Educated  Patient    Methods  Explanation;Verbal cues    Comprehension  Verbalized understanding;Returned demonstration          PT Long Term Goals - 06/12/18 1739      PT LONG TERM GOAL #1   Title  Pt. independent with HEP to increase lumbar AROM 50% all planes of movement with no increase c/o pain to improve daily tasks.     Baseline  Decrease extension (75% limited).  Increase R rotn. noted.     Time  4    Period  Weeks    Status  Partially Met    Target Date  07/09/18  PT LONG TERM GOAL #2   Title  Pt. will increase core/LE muscle strength 1/2 muscle grade to improve pain-free mobility.      Baseline  B LE muscle strength grossly 4+/5 MMT except hip flexion 4/5 MMT.  Progresing core stability program.      Time  4    Period  Weeks    Status  Partially Met    Target Date  07/09/18      PT LONG TERM GOAL #3   Title  Pt. will demonstrate proper body/ lifting mechanics with no c/o low back pain.      Baseline  Good mechanics    Period  Weeks    Status  Achieved    Target Date  06/11/18      PT LONG TERM GOAL #4   Title  Pt. will increase FOTO from 46 to 66 to improve pain-fre mobility with daily tasks.      Baseline  FOTO baseline on 5/21: 46.   6/18: 72    Time  4    Period  Weeks    Status  Achieved    Target Date  06/11/18      PT LONG TERM GOAL #5   Title  Pt. able to return to work with no limitations or restrictions with lifting.      Baseline  pt. remains out of work at  this time    Time  4    Period  Weeks    Status  New    Target Date  07/09/18            Plan - 06/13/18 1342    Clinical Impression Statement  Pt. is continuing to make improvements in core strength with exercises and reports no pain when performing.  Pt. experienced slight discomfort with palpation in lumbar region on R side.  Pt.  will continue to increase core stability needed for job demands and lifting of heavy objects.      Clinical Presentation  Evolving    Clinical Decision Making  Moderate    Rehab Potential  Good    PT Frequency  2x / week    PT Duration  4 weeks    PT Treatment/Interventions  ADLs/Self Care Home Management;Aquatic Therapy;Moist Heat;Electrical Stimulation;Cryotherapy;Traction;Ultrasound;Therapeutic activities;Functional mobility training;Stair training;Gait training;Therapeutic exercise;Balance training;Neuromuscular re-education;Patient/family education;Manual techniques;Passive range of motion;Dry needling    PT Next Visit Plan  Assess LE extensability    PT Home Exercise Plan  see handouts       Patient will benefit from skilled therapeutic intervention in order to improve the following deficits and impairments:  Pain, Improper body mechanics, Hypomobility, Decreased strength, Decreased range of motion, Decreased endurance, Decreased activity tolerance, Difficulty walking  Visit Diagnosis: Chronic right-sided low back pain with right-sided sciatica  Muscle weakness (generalized)  Joint stiffness of spine     Problem List Patient Active Problem List   Diagnosis Date Noted  . Acute right-sided low back pain without sciatica 02/07/2018  . Injury of right ankle 08/13/2017  . Left lower lobe pneumonia ([place]) 06/20/2017  . Skin rash 06/06/2017  . Syncope 05/17/2017  . Near syncope 02/21/2017  . Kidney stones 11/22/2016  . Healthcare maintenance 02/13/2013  . Smoker   . IBS (irritable bowel syndrome)    This entire session was performed  under direct supervision and direction of a licensed therapist/therapist assistant . I have personally read, edited and approve of the note as written.   Gwenlyn Saran SPT Corene Cornea D  Huprich PT, DPT   Huprich,Jason 06/14/2018, 1:03 PM  [facility] [address] [place], [place], [address] Phone: [phone]   Fax:  [phone]  Name: Jacqueline [name] MRN: [id number] Date of Birth: 1985/12/29

## 2018-06-18 ENCOUNTER — Ambulatory Visit: Payer: 59 | Admitting: Physical Therapy

## 2018-06-18 ENCOUNTER — Encounter: Payer: Self-pay | Admitting: Physical Therapy

## 2018-06-18 DIAGNOSIS — M5441 Lumbago with sciatica, right side: Principal | ICD-10-CM

## 2018-06-18 DIAGNOSIS — G8929 Other chronic pain: Secondary | ICD-10-CM

## 2018-06-18 DIAGNOSIS — M6281 Muscle weakness (generalized): Secondary | ICD-10-CM

## 2018-06-18 DIAGNOSIS — M2560 Stiffness of unspecified joint, not elsewhere classified: Secondary | ICD-10-CM

## 2018-06-18 NOTE — Therapy (Signed)
[facility] [address] [place], [place], [address] Phone: [phone]   Fax:  [phone]  Physical Therapy Treatment  Patient Details  Name: Jacqueline [name] MRN: [id number] Date of Birth: November 25, 1986 Referring Provider: Meda Coffee, MD   Encounter Date: 06/18/2018  Treatment 11 of 17.  Recert date:  2/35/36  Past Medical History:  Diagnosis Date  . IBS (irritable bowel syndrome)    h/o this  . Smoker     Past Surgical History:  Procedure Laterality Date  . CESAREAN SECTION  2012  . CHOLECYSTECTOMY  2005  . COLONOSCOPY  2006   WNL per pt  . ROTATOR CUFF REPAIR  2004   right  . SHOULDER SURGERY  2012   bone spurs; right    There were no vitals filed for this visit.  Subjective Assessment - 06/20/18 1005    Subjective  Pt. reports that she is not as limber today.  Pt. states that she was unable to perform stretches before coming to therapy.    Pertinent History  Pt. has benefitted from use of Prednisone and has received 1 injection a few weeks ago.  Pt. returns to MD 06/12/18.  Pt. has not tried back brace.      Limitations  Sitting;Standing;Walking;House hold activities    Patient Stated Goals  Decrease LBP/ return to work (mid-August).      Currently in Pain?  No/denies    Pain Score  0-No pain         Treatment:  There Ex:  TM walking 2.3 mph for 10 min. With varying incline (0-10%).   5# hallway walking/ lateral steps with increase hip flexoin.  TG knee flexion 20x each (midline/ toe in/ toe out) 2x20 each / Heel Raises/Gastroc stretches 2x25 Supine bridges with 10lb 2x20 Supine Trunk Rotation 2x25 Supine Isometric transverse crunches with green yoga ball 2x20 Seated ball ex./ scapular retraction/ diagonals RTB Supine Deadbugs 2x20 B Birddogs on mat 2x20 B Prone press-ups (partial to full).   Manual:  Supine LE/lumbar generalized stretches Prone STM to Lumbar region Prone Central PA's to Lumber  region     Marked increase in lumbar extension noted after physioball ex./ prone press-ups. Pt. able to complete pull press-up with elbow extension with no radicular symptoms (slight increase in pain noted). Pt. progressing well with core/ LE stability ex. program. No chnages to HEP at this time and PT encouraged pt. to remain compliant with daily HEP/ proper body mechanics.    PT Long Term Goals - 06/12/18 1739      PT LONG TERM GOAL #1   Title  Pt. independent with HEP to increase lumbar AROM 50% all planes of movement with no increase c/o pain to improve daily tasks.     Baseline  Decrease extension (75% limited).  Increase R rotn. noted.     Time  4    Period  Weeks    Status  Partially Met    Target Date  07/09/18      PT LONG TERM GOAL #2   Title  Pt. will increase core/LE muscle strength 1/2 muscle grade to improve pain-free mobility.      Baseline  B LE muscle strength grossly 4+/5 MMT except hip flexion 4/5 MMT.  Progresing core stability program.      Time  4    Period  Weeks    Status  Partially Met    Target Date  07/09/18  PT LONG TERM GOAL #3   Title  Pt. will demonstrate proper body/ lifting mechanics with no c/o low back pain.      Baseline  Good mechanics    Period  Weeks    Status  Achieved    Target Date  06/11/18      PT LONG TERM GOAL #4   Title  Pt. will increase FOTO from 46 to 66 to improve pain-fre mobility with daily tasks.      Baseline  FOTO baseline on 5/21: 46.   6/18: 72    Time  4    Period  Weeks    Status  Achieved    Target Date  06/11/18      PT LONG TERM GOAL #5   Title  Pt. able to return to work with no limitations or restrictions with lifting.      Baseline  pt. remains out of work at this time    Time  4    Period  Weeks    Status  New    Target Date  07/09/18         Patient will benefit from skilled therapeutic intervention in order to improve the following deficits and impairments:  Pain, Improper body mechanics,  Hypomobility, Decreased strength, Decreased range of motion, Decreased endurance, Decreased activity tolerance, Difficulty walking  Visit Diagnosis: Chronic right-sided low back pain with right-sided sciatica  Muscle weakness (generalized)  Joint stiffness of spine     Problem List Patient Active Problem List   Diagnosis Date Noted  . Acute right-sided low back pain without sciatica 02/07/2018  . Injury of right ankle 08/13/2017  . Left lower lobe pneumonia ([place]) 06/20/2017  . Skin rash 06/06/2017  . Syncope 05/17/2017  . Near syncope 02/21/2017  . Kidney stones 11/22/2016  . Healthcare maintenance 02/13/2013  . Smoker   . IBS (irritable bowel syndrome)    Pura Spice, PT, DPT # [phone] 06/21/2018, 11:27 AM  [facility] [address] [place], [place], [address] Phone: [phone]   Fax:  [phone]  Name: Finnleigh Marchetti MRN: [id number] Date of Birth: 1986-02-20

## 2018-06-20 ENCOUNTER — Encounter: Payer: Self-pay | Admitting: Physical Therapy

## 2018-06-20 ENCOUNTER — Ambulatory Visit: Payer: 59 | Admitting: Physical Therapy

## 2018-06-20 DIAGNOSIS — M2560 Stiffness of unspecified joint, not elsewhere classified: Secondary | ICD-10-CM

## 2018-06-20 DIAGNOSIS — G8929 Other chronic pain: Secondary | ICD-10-CM

## 2018-06-20 DIAGNOSIS — M5441 Lumbago with sciatica, right side: Principal | ICD-10-CM

## 2018-06-20 DIAGNOSIS — M6281 Muscle weakness (generalized): Secondary | ICD-10-CM

## 2018-06-20 NOTE — Therapy (Signed)
[facility] [address] [place], [place], [address] Phone: [phone]   Fax:  [phone]  Physical Therapy Treatment  Patient Details  Name: Jacqueline [name] MRN: [id number] Date of Birth: 08-18-1986 Referring Provider: Meda Coffee, MD   Encounter Date: 06/20/2018  PT End of Session - 06/20/18 1007    Visit Number  12    Number of Visits  17    Date for PT Re-Evaluation  07/09/18    PT Start Time  0726    PT Stop Time  0843    PT Time Calculation (min)  77 min    Activity Tolerance  Patient tolerated treatment well;Patient limited by pain    Behavior During Therapy  [facility] for tasks assessed/performed       Past Medical History:  Diagnosis Date  . IBS (irritable bowel syndrome)    h/o this  . Smoker     Past Surgical History:  Procedure Laterality Date  . CESAREAN SECTION  2012  . CHOLECYSTECTOMY  2005  . COLONOSCOPY  2006   WNL per pt  . ROTATOR CUFF REPAIR  2004   right  . SHOULDER SURGERY  2012   bone spurs; right    There were no vitals filed for this visit.  Subjective Assessment - 06/20/18 1005    Subjective  Pt. reports that she is not as limber today.  Pt. states that she was unable to perform stretches before coming to therapy.    Pertinent History  Pt. has benefitted from use of Prednisone and has received 1 injection a few weeks ago.  Pt. returns to MD 06/12/18.  Pt. has not tried back brace.      Limitations  Sitting;Standing;Walking;House hold activities    Patient Stated Goals  Decrease LBP/ return to work (mid-August).      Currently in Pain?  No/denies    Pain Score  0-No pain          Treatment:   There Ex:   Prone Ball Y's, T's, I's 2x10 2# each hand Seated  On grey ball - circles, lateral, front/back 2x10 each Supine extension over grey ball 3x30 sec hold Seated Deadbugs on grey ball 2x25 Seated [place] Twists on [employer] ball 2x10 with 10# weight Supine bridges 2x20 Supine TA  Contractions 2x15 Supine B SLR 2x10 Supine bridges with SLR 2x15 Supine Marches 2x15 each Birdogs 2x15    Manual:   Supine LE/lumbar generalized stretches (18 minutes) Focus on LE Extension and Quad stretching    PT Education - 06/20/18 1006    Education provided  Yes    Education Details  Pt. educated on newly given piriformis stretch to do at home.    Person(s) Educated  Patient    Methods  Explanation;Verbal cues;Tactile cues    Comprehension  Verbalized understanding;Returned demonstration          PT Long Term Goals - 06/12/18 1739      PT LONG TERM GOAL #1   Title  Pt. independent with HEP to increase lumbar AROM 50% all planes of movement with no increase c/o pain to improve daily tasks.     Baseline  Decrease extension (75% limited).  Increase R rotn. noted.     Time  4    Period  Weeks    Status  Partially Met    Target Date  07/09/18      PT LONG TERM GOAL #2   Title  Pt. will increase core/LE  muscle strength 1/2 muscle grade to improve pain-free mobility.      Baseline  B LE muscle strength grossly 4+/5 MMT except hip flexion 4/5 MMT.  Progresing core stability program.      Time  4    Period  Weeks    Status  Partially Met    Target Date  07/09/18      PT LONG TERM GOAL #3   Title  Pt. will demonstrate proper body/ lifting mechanics with no c/o low back pain.      Baseline  Good mechanics    Period  Weeks    Status  Achieved    Target Date  06/11/18      PT LONG TERM GOAL #4   Title  Pt. will increase FOTO from 46 to 66 to improve pain-fre mobility with daily tasks.      Baseline  FOTO baseline on 5/21: 46.   6/18: 72    Time  4    Period  Weeks    Status  Achieved    Target Date  06/11/18      PT LONG TERM GOAL #5   Title  Pt. able to return to work with no limitations or restrictions with lifting.      Baseline  pt. remains out of work at this time    Time  [place]    Target Date  07/09/18             Plan - 06/20/18 1008    Clinical Impression Statement  Pt. presented to threapy with limited movement in thoracic region today.  After performing ther ex and manual therapy, pt. was able to progress towards greater AROM in lumbar and thoracic region of spine.  Pt. continues to increase strength in core and B LEs needed for job requirements.      Clinical Presentation  Evolving    Clinical Decision Making  Moderate    Rehab Potential  Good    PT Frequency  2x / week    PT Duration  4 weeks    PT Treatment/Interventions  ADLs/Self Care Home Management;Aquatic Therapy;Moist Heat;Electrical Stimulation;Cryotherapy;Traction;Ultrasound;Therapeutic activities;Functional mobility training;Stair training;Gait training;Therapeutic exercise;Balance training;Neuromuscular re-education;Patient/family education;Manual techniques;Passive range of motion;Dry needling    PT Next Visit Plan  Assess LE extensability    PT Home Exercise Plan  see handouts       Patient will benefit from skilled therapeutic intervention in order to improve the following deficits and impairments:  Pain, Improper body mechanics, Hypomobility, Decreased strength, Decreased range of motion, Decreased endurance, Decreased activity tolerance, Difficulty walking  Visit Diagnosis: Chronic right-sided low back pain with right-sided sciatica  Muscle weakness (generalized)  Joint stiffness of spine     Problem List Patient Active Problem List   Diagnosis Date Noted  . Acute right-sided low back pain without sciatica 02/07/2018  . Injury of right ankle 08/13/2017  . Left lower lobe pneumonia ([place]) 06/20/2017  . Skin rash 06/06/2017  . Syncope 05/17/2017  . Near syncope 02/21/2017  . Kidney stones 11/22/2016  . Healthcare maintenance 02/13/2013  . Smoker   . IBS (irritable bowel syndrome)    Pura Spice, PT, DPT # [id number] Gwenlyn Saran, SPT 06/20/2018, 10:20 AM  [facility]  [facility] [address] [place], [place], [address] Phone: [phone]   Fax:  [phone]  Name: Jacqueline [name] MRN: [id number] Date of Birth: [birth year]

## 2018-06-25 ENCOUNTER — Encounter: Payer: 59 | Admitting: Physical Therapy

## 2018-07-02 ENCOUNTER — Encounter: Payer: 59 | Admitting: Physical Therapy

## 2018-07-04 ENCOUNTER — Encounter: Payer: 59 | Admitting: Physical Therapy

## 2019-01-01 IMAGING — MR MR LUMBAR SPINE W/O CM
5 series · 45 of 48 positions shown · non-contrast
Comparison: Radiographs dated 02/07/2018

CLINICAL DATA: Acute right-sided low back pain. Bilateral leg
weakness. Bilateral foot numbness.

EXAM:
MRI LUMBAR SPINE WITHOUT CONTRAST
TECHNIQUE: Multiplanar, multisequence MR imaging of the lumbar spine was
performed. No intravenous contrast was administered.

[Series 3: tirm sag · sagittal · 4.0mm · 0.55mm/px · 6 of 13 slices shown]
[im 1/13]
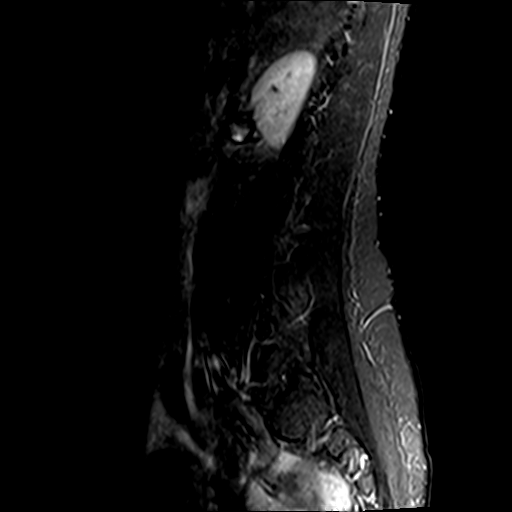
[im 3/13]
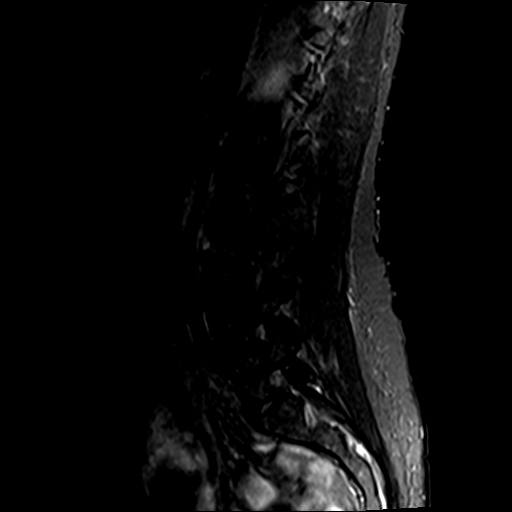
[im 5/13]
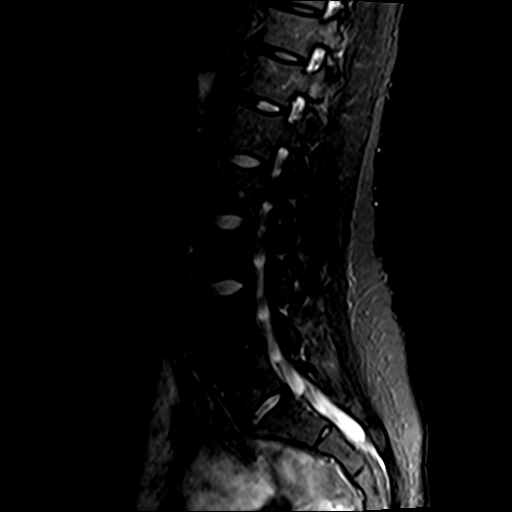
[im 8/13]
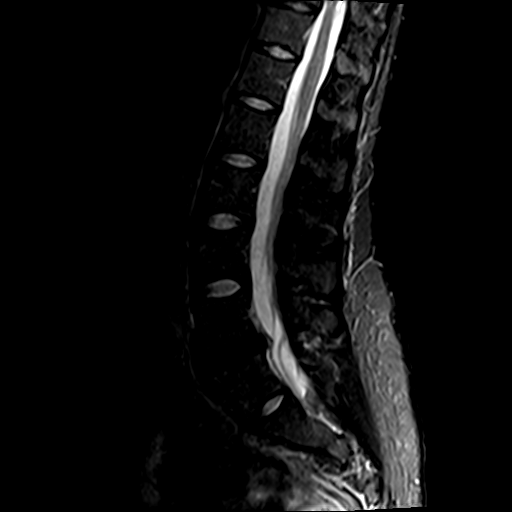
[im 10/13]
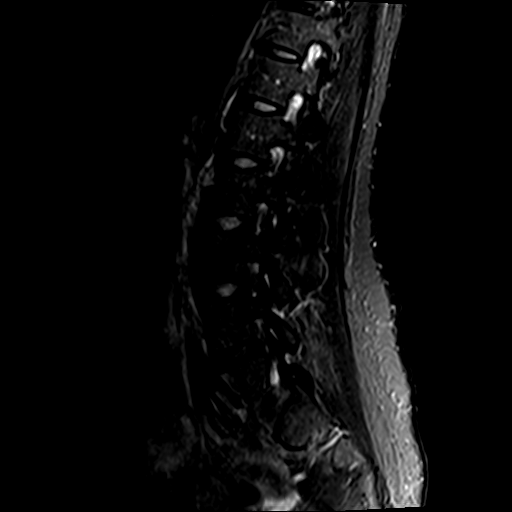
[im 13/13]
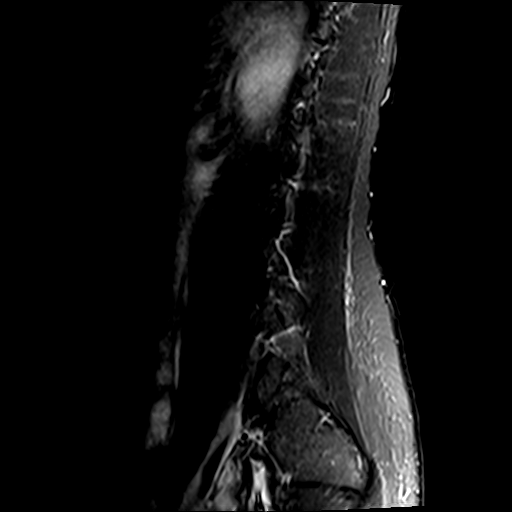

[Series 4: T1 · sagittal · 4.0mm · 0.88mm/px · 6 of 13 slices shown (1 of 2)]
[im 1/13]
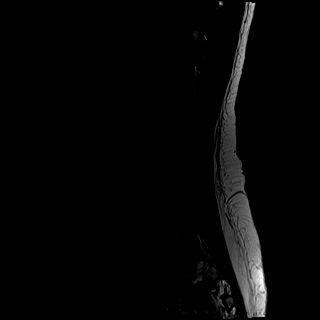
[im 3/13]
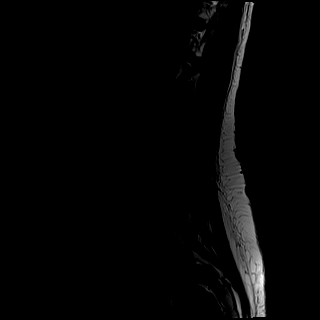
[im 5/13]
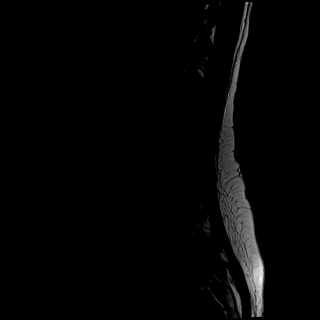
[im 8/13]
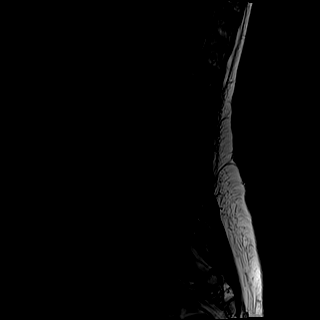
[im 10/13]
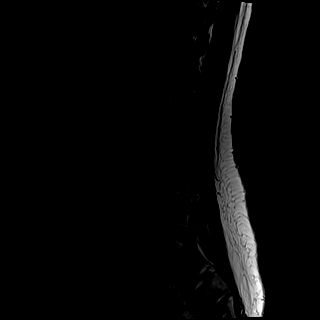
[im 13/13]
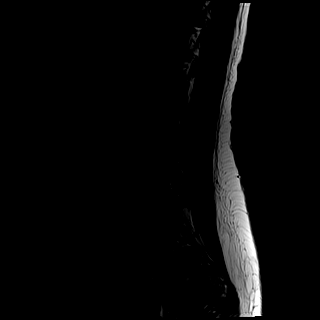

[Series 5: T2 post-contrast · sagittal · 4.0mm · 0.88mm/px · 6 of 13 slices shown]
[im 1/13]
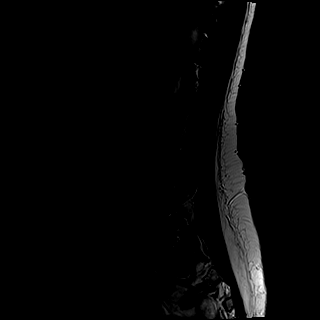
[im 3/13]
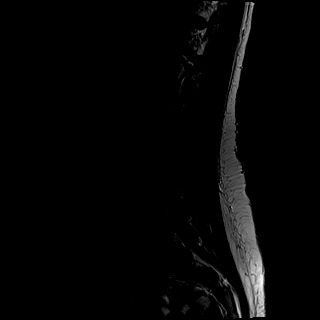
[im 5/13]
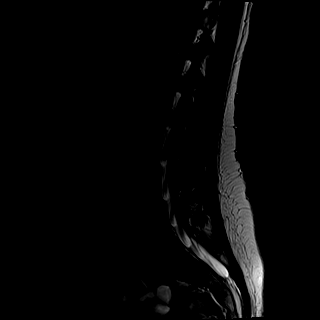
[im 8/13]
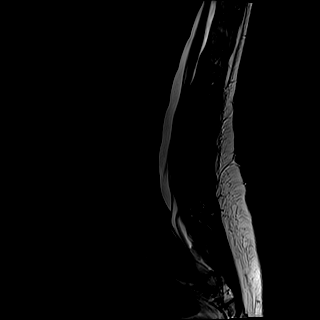
[im 10/13]
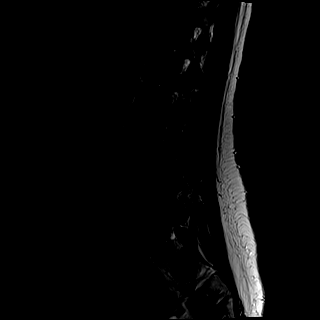
[im 13/13]
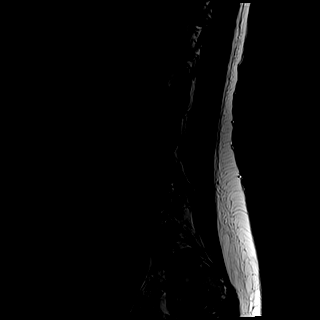

[Series 6: T2 · axial · 4.0mm · 0.78mm/px · z∈[-67,+140]mm · 15 of 35 slices shown]
[im 1/35]
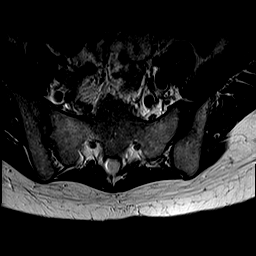
[im 3/35]
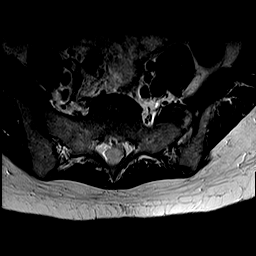
[im 5/35]
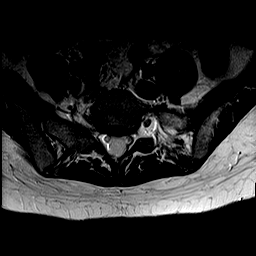
[im 8/35]
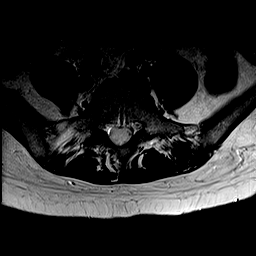
[im 10/35]
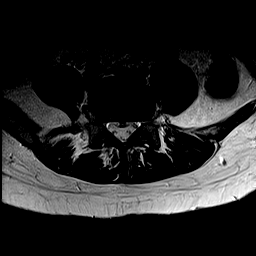
[im 13/35]
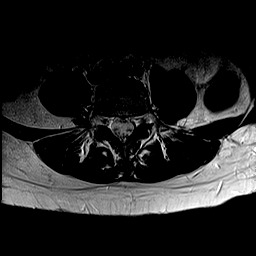
[im 15/35]
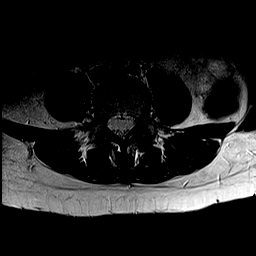
[im 18/35]
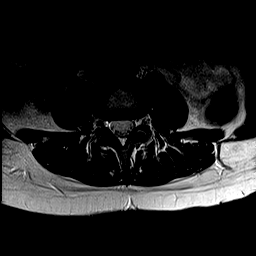
[im 20/35]
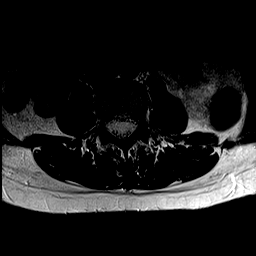
[im 22/35]
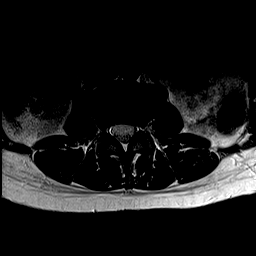
[im 25/35]
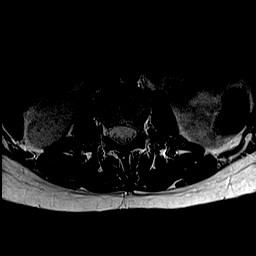
[im 27/35]
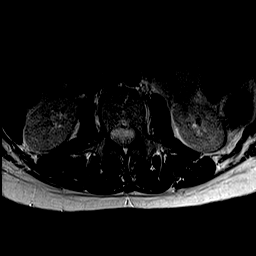
[im 30/35]
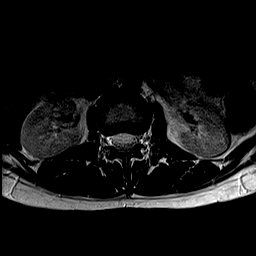
[im 32/35]
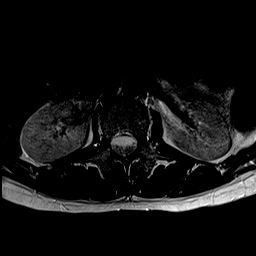
[im 35/35]
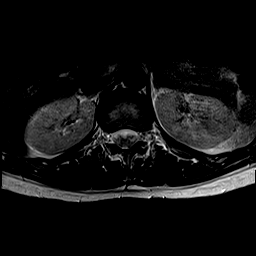

[Series 7: T1 · axial · 4.0mm · 0.78mm/px · z∈[-67,+140]mm · 12 of 35 slices shown (2 of 2)]
[im 1/35]
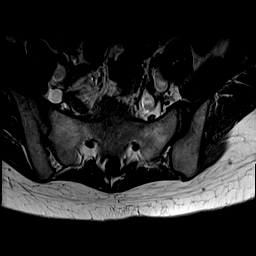
[im 3/35]
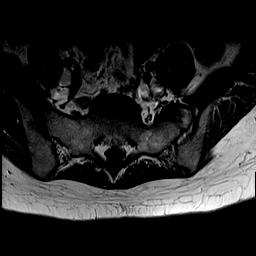
[im 5/35]
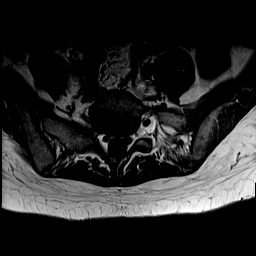
[im 8/35]
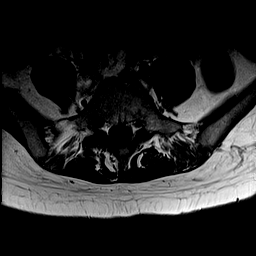
[im 10/35]
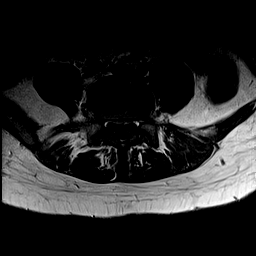
[im 13/35]
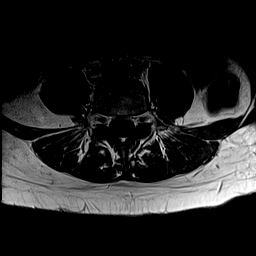
[im 15/35]
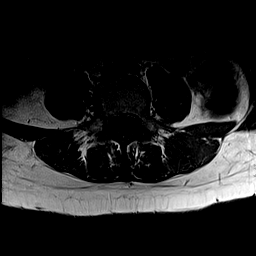
[im 18/35]
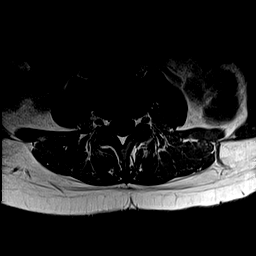
[im 20/35]
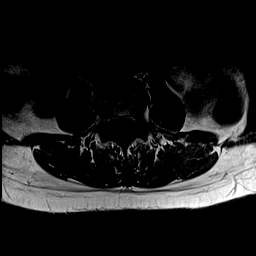
[im 25/35]
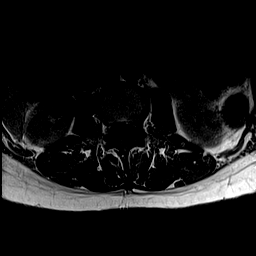
[im 30/35]
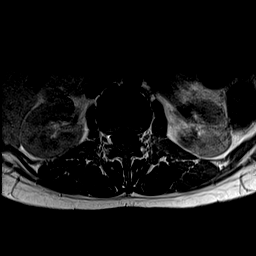
[im 35/35]
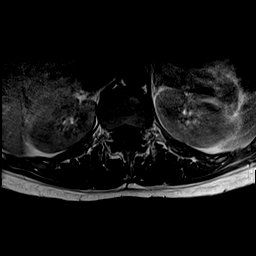

[45 of 48 positions shown; findings below may reference images not displayed]

FINDINGS: Segmentation: Standard. Prominent left transverse process of L5
articulates with S1.

Alignment:  Physiologic.

Vertebrae:  No fracture, evidence of discitis, or bone lesion.

Conus medullaris and cauda equina: Conus extends to the L1 level.
Conus and cauda equina appear normal.

Paraspinal and other soft tissues: Negative.

Disc levels:

T12-L1 through L3-4: Normal.

L4-5: Prominent central soft disc protrusion symmetrically
compressing the ventral aspect of the thecal sac without focal
neural impingement. Disc desiccation. No foraminal stenosis. No
facet arthritis.

L5-S1: Normal.
IMPRESSION: Prominent central soft disc protrusion at L4-5 compressing the
thecal sac but without focal neural impingement.

Otherwise, normal exam.

## 2019-01-08 DIAGNOSIS — M4826 Kissing spine, lumbar region: Secondary | ICD-10-CM | POA: Diagnosis not present

## 2019-01-08 DIAGNOSIS — M5116 Intervertebral disc disorders with radiculopathy, lumbar region: Secondary | ICD-10-CM | POA: Diagnosis not present

## 2019-01-08 DIAGNOSIS — M47817 Spondylosis without myelopathy or radiculopathy, lumbosacral region: Secondary | ICD-10-CM | POA: Diagnosis not present

## 2019-01-09 DIAGNOSIS — M5116 Intervertebral disc disorders with radiculopathy, lumbar region: Secondary | ICD-10-CM | POA: Diagnosis not present

## 2019-01-20 DIAGNOSIS — S3992XA Unspecified injury of lower back, initial encounter: Secondary | ICD-10-CM | POA: Diagnosis not present

## 2019-01-20 DIAGNOSIS — M47816 Spondylosis without myelopathy or radiculopathy, lumbar region: Secondary | ICD-10-CM | POA: Diagnosis not present

## 2019-01-20 DIAGNOSIS — M5136 Other intervertebral disc degeneration, lumbar region: Secondary | ICD-10-CM | POA: Diagnosis not present

## 2019-01-23 DIAGNOSIS — M47817 Spondylosis without myelopathy or radiculopathy, lumbosacral region: Secondary | ICD-10-CM | POA: Diagnosis not present

## 2019-01-23 DIAGNOSIS — M5116 Intervertebral disc disorders with radiculopathy, lumbar region: Secondary | ICD-10-CM | POA: Diagnosis not present

## 2019-01-23 DIAGNOSIS — M4726 Other spondylosis with radiculopathy, lumbar region: Secondary | ICD-10-CM | POA: Diagnosis not present

## 2019-02-14 DIAGNOSIS — M4726 Other spondylosis with radiculopathy, lumbar region: Secondary | ICD-10-CM | POA: Diagnosis not present

## 2019-02-14 DIAGNOSIS — M4727 Other spondylosis with radiculopathy, lumbosacral region: Secondary | ICD-10-CM | POA: Diagnosis not present

## 2019-02-14 DIAGNOSIS — M5116 Intervertebral disc disorders with radiculopathy, lumbar region: Secondary | ICD-10-CM | POA: Diagnosis not present

## 2019-02-28 DIAGNOSIS — M5116 Intervertebral disc disorders with radiculopathy, lumbar region: Secondary | ICD-10-CM | POA: Diagnosis not present

## 2019-02-28 DIAGNOSIS — M4726 Other spondylosis with radiculopathy, lumbar region: Secondary | ICD-10-CM | POA: Diagnosis not present

## 2019-02-28 DIAGNOSIS — M47817 Spondylosis without myelopathy or radiculopathy, lumbosacral region: Secondary | ICD-10-CM | POA: Diagnosis not present

## 2019-03-03 DIAGNOSIS — M47816 Spondylosis without myelopathy or radiculopathy, lumbar region: Secondary | ICD-10-CM | POA: Diagnosis not present

## 2019-03-03 DIAGNOSIS — M47817 Spondylosis without myelopathy or radiculopathy, lumbosacral region: Secondary | ICD-10-CM | POA: Diagnosis not present

## 2019-03-18 DIAGNOSIS — M5136 Other intervertebral disc degeneration, lumbar region: Secondary | ICD-10-CM | POA: Diagnosis not present

## 2019-03-18 DIAGNOSIS — M5126 Other intervertebral disc displacement, lumbar region: Secondary | ICD-10-CM | POA: Diagnosis not present

## 2019-03-18 DIAGNOSIS — M545 Low back pain: Secondary | ICD-10-CM | POA: Diagnosis not present

## 2019-03-18 DIAGNOSIS — M5416 Radiculopathy, lumbar region: Secondary | ICD-10-CM | POA: Diagnosis not present

## 2019-06-25 ENCOUNTER — Other Ambulatory Visit: Payer: Self-pay

## 2019-06-25 ENCOUNTER — Telehealth (INDEPENDENT_AMBULATORY_CARE_PROVIDER_SITE_OTHER): Payer: BC Managed Care – PPO | Admitting: Family Medicine

## 2019-06-25 ENCOUNTER — Encounter: Payer: Self-pay | Admitting: Family Medicine

## 2019-06-25 VITALS — Ht 63.0 in | Wt 130.0 lb

## 2019-06-25 DIAGNOSIS — F17200 Nicotine dependence, unspecified, uncomplicated: Secondary | ICD-10-CM | POA: Diagnosis not present

## 2019-06-25 DIAGNOSIS — N921 Excessive and frequent menstruation with irregular cycle: Secondary | ICD-10-CM | POA: Diagnosis not present

## 2019-06-25 DIAGNOSIS — G43829 Menstrual migraine, not intractable, without status migrainosus: Secondary | ICD-10-CM | POA: Diagnosis not present

## 2019-06-25 MED ORDER — NAPROXEN 375 MG PO TABS: 375.0000 mg | ORAL_TABLET | Freq: Two times a day (BID) | ORAL | 1 refills | Status: AC

## 2019-06-25 MED ORDER — CHANTIX STARTING MONTH PAK 0.5 MG X 11 & 1 MG X 42 PO TABS: ORAL_TABLET | ORAL | 0 refills | Status: DC

## 2019-06-25 MED ORDER — VARENICLINE TARTRATE 1 MG PO TABS: 1.0000 mg | ORAL_TABLET | Freq: Two times a day (BID) | ORAL | 1 refills | Status: DC

## 2019-06-25 NOTE — Assessment & Plan Note (Addendum)
Encouraged full cessation. She does have increased stressors around work and divorce. Chantix refilled per pt request. She has tried before.

## 2019-06-25 NOTE — Assessment & Plan Note (Signed)
At higher risk for hormonal therapy due to age and smoking status. Will refer to GYN for further management. In interim, suggested continue excedrin migraine PRN, start magnesium 500mg  daily OTC on 15th day of cycle, continue until next period each cycle preventatively, will Rx naprosyn 375mg  (lower dose due to GI upset) BID PRN menstrual migraine, consider starting prior to period or at onset of migraine, continuing 3 days into cycle.

## 2019-06-25 NOTE — Assessment & Plan Note (Signed)
Referral to GYN for further eval/management.

## 2019-06-25 NOTE — Progress Notes (Signed)
Virtual visit attempted through MyChart with technical difficulties, completed through Doxy.Me - transitioned to audio call only due to technical difficulties again. Due to national recommendations of social distancing due to COVID-19, a virtual visit is felt to be most appropriate for this patient at this time. Reviewed limitations of a virtual visit.   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    Ht 5\' 3"  (1.6 m)   Wt 130 lb (59 kg)   LMP 06/23/2019   BMI 23.03 kg/m    CC: discuss birth control Subjective:    Patient ID: Jacqueline [name], female    DOB: October 16, 1986, 33 y.o.   MRN: [id number]  HPI: Zada Haser is a 33 y.o. female presenting on 06/25/2019 for Contraception (Wants to discuss restarting NuvaRing.)   Currently working for [employer] in [place]. Started new job 8 months ago. Separating from husband - getting divorce.   Currently not sexually active. Denies any chance of pregnancy.  LMP - currently on period.   Previously on NuvaRing for years, tolerated well except for intermittent spotting.  Bad menstrual migraines around cycles, increasing irregular periods with menorrhagia, increasing frequency of periods this month (q2wks) attributed to stress. Goes through 5-6 super tampons per day. Prior trial of different OCPs caused nausea/GI upset. Prior nexplanon caused nonstop bleeding for 6 months. Doesn't think she'd be interested in IUD.   Current smoker - 3/4 ppd. Encouraged cessation. Requests chantix Rx.   Main concern is menstrual migraines - nausea, photo/phonophobia, activity limiting. Start a few days before period, last 1-2 days into period. Manages with excedrin migraine with benefit. No aura with migraine.   H/o LLL PNA 01/2018. No known personal or family history of blood clots.  No known fmhx breast cancer.  Ongoing back  pain - managed with lyrica. Completed PT, 8 epidural and facet injections.      Relevant past medical, surgical, family and social history reviewed and updated as indicated. Interim medical history since our last visit reviewed. Allergies and medications reviewed and updated. Outpatient Medications Prior to Visit  Medication Sig Dispense Refill  . pregabalin (LYRICA) 75 MG capsule Take 75 mg by mouth 2 (two) times daily. Takes 2 capsules in AM and 1 capsule at bedtime    . HYDROcodone-acetaminophen (NORCO/VICODIN) 5-325 MG tablet Take 1 tablet by mouth 3 (three) times daily as needed for moderate pain. 15 tablet 0  . methocarbamol (ROBAXIN) 500 MG tablet Take 1 tablet (500 mg total) by mouth every 8 (eight) hours as needed for muscle spasms. 30 tablet 0  . predniSONE (DELTASONE) 20 MG tablet Take two tablets daily for 3 days followed by one tablet daily for 4 days 10 tablet 0   No facility-administered medications prior to visit.      Per HPI unless specifically indicated in ROS section below Review of Systems Objective:    Ht 5\' 3"  (1.6 m)   Wt 130 lb (59 kg)   LMP 06/23/2019   BMI 23.03 kg/m   Wt Readings from Last 3 Encounters:  06/25/19 130 lb (59 kg)  02/12/18 132 lb (59.9 kg)  02/07/18 130 lb 4 oz (59.1 kg)     Physical exam: Gen: alert, NAD, not ill appearing Pulm: speaks in complete sentences without increased work of breathing Psych: normal mood, normal thought content      Assessment & Plan:   Problem  List Items Addressed This Visit    Smoker    Encouraged full cessation. She does have increased stressors around work and divorce. Chantix refilled per pt request. She has tried before.       Menstrual migraine without status migrainosus, not intractable - Primary    At higher risk for hormonal therapy due to age and smoking status. Will refer to GYN for further management. In interim, suggested continue excedrin migraine PRN, start magnesium 500mg  daily OTC on 15th  day of cycle, continue until next period each cycle preventatively, will Rx naprosyn 375mg  (lower dose due to GI upset) BID PRN menstrual migraine, consider starting prior to period or at onset of migraine, continuing 3 days into cycle.       Relevant Medications   pregabalin (LYRICA) 75 MG capsule   naproxen (NAPROSYN) 375 MG tablet   Other Relevant Orders   Ambulatory referral to Gynecology   Menorrhagia with irregular cycle    Referral to GYN for further eval/management.       Relevant Orders   Ambulatory referral to Gynecology       Meds ordered this encounter  Medications  . naproxen (NAPROSYN) 375 MG tablet    Sig: Take 1 tablet (375 mg total) by mouth 2 (two) times daily with a meal. for 5 days for menstrual migraines (start 3 days before period)    Dispense:  40 tablet    Refill:  1  . varenicline (CHANTIX STARTING MONTH PAK) 0.5 MG X 11 & 1 MG X 42 tablet    Sig: Take one 0.5 mg tablet by mouth once daily for 3 days, then increase to one 0.5 mg tablet twice daily for 4 days, then increase to one 1 mg tablet twice daily.    Dispense:  53 tablet    Refill:  0  . varenicline (CHANTIX CONTINUING MONTH PAK) 1 MG tablet    Sig: Take 1 tablet (1 mg total) by mouth 2 (two) times daily.    Dispense:  60 tablet    Refill:  1   Orders Placed This Encounter  Procedures  . Ambulatory referral to Gynecology    Referral Priority:   Routine    Referral Type:   Consultation    Referral Reason:   Specialty Services Required    Requested Specialty:   Gynecology    Number of Visits Requested:   1    I discussed the assessment and treatment plan with the patient. The patient was provided an opportunity to ask questions and all were answered. The patient agreed with the plan and demonstrated an understanding of the instructions. The patient was advised to call back or seek an in-person evaluation if the symptoms worsen or if the condition fails to improve as anticipated.  Follow up  plan: No follow-ups on file.  Eustaquio BoydenJavier Cairo Agostinelli, MD

## 2019-06-30 ENCOUNTER — Telehealth: Payer: Self-pay | Admitting: Obstetrics & Gynecology

## 2019-06-30 NOTE — Telephone Encounter (Signed)
[facility] referring for Menstrual migraine without status migrainosus, not intractable , Menorrhagia with irregular cycle. Called and left voicemail for patient to call back to be schedule

## 2019-07-02 NOTE — Telephone Encounter (Signed)
Called and left voice mail for patient to call back to be schedule °

## 2019-07-03 NOTE — Telephone Encounter (Signed)
Called and left voice mail for patient to call back to be schedule °

## 2019-09-10 DIAGNOSIS — N390 Urinary tract infection, site not specified: Secondary | ICD-10-CM | POA: Diagnosis not present

## 2020-08-20 ENCOUNTER — Telehealth: Payer: Self-pay | Admitting: Family Medicine

## 2020-08-20 NOTE — Telephone Encounter (Signed)
Pt called to schedule appointment for bp.  She stated her bp has been running 165/95 or 165/100.  I transferred her to triage.  Pt called back stating they wanted to her be see within 3days  You first appointment 9/3 is it ok wait

## 2020-08-20 NOTE — Telephone Encounter (Signed)
May place at 12:30 on 08/24/2020. Thanks.

## 2020-08-23 NOTE — Telephone Encounter (Signed)
[facility] [place] Day - Client TELEPHONE ADVICE RECORD [facility] Patient Name: Jacqueline [name] Gender: Female DOB: 07/15/1986 Age: 34 Y 6 M 7 D Return Phone Number: [phone] (Primary), [phone] (Secondary) Address[place] [address] Client [facility] Day - Client Client Site [facility] - Day Physician Eustaquio Boyden - MD Contact Type Call Who Is Calling Patient / Member / Family / Caregiver Call Type Triage / Clinical Relationship To Patient Self Return Phone Number [phone] (Primary) Chief Complaint Blood Pressure High Reason for Call Symptomatic / Request for Health Information Initial Comment Caller states she has blood pressure is 160/100 no headache or dizziness. Translation No Nurse Assessment Nurse: Daphine Deutscher, RN, Melanie Date/Time (Eastern Time): 08/20/2020 3:59:45 PM Confirm and document reason for call. If symptomatic, describe symptoms. ---Caller states her BP has been running 160/100. She does not know what it is right now but wants an appointment. Caller is not able to check BP Has the patient had close contact with a person known or suspected to have the novel coronavirus illness OR traveled / lives in area with major community spread (including international travel) in the last 14 days from the onset of symptoms? * If Asymptomatic, screen for exposure and travel within the last 14 days. ---No Does the patient have any new or worsening symptoms? ---Yes Will a triage be completed? ---Yes Related visit to physician within the last 2 weeks? ---No Does the PT have any chronic conditions? (i.e. diabetes, asthma, this includes High risk factors for pregnancy, etc.) ---No Is the patient pregnant or possibly pregnant? (Ask all females between the ages of 26-55) ---No Is this a behavioral health or substance abuse call? ---No Guidelines Guideline Title Affirmed Question  Affirmed Notes Nurse Date/Time (Eastern Time) Blood Pressure - High Systolic BP >= 160 OR Diastolic >= 100 Daphine Deutscher, RN, Shawna Orleans 08/20/2020 4:02:09 PM Disp. Time Lamount Cohen Time) Disposition Final User 08/20/2020 4:04:30 PM SEE PCP WITHIN 3 DAYS Yes Daphine Deutscher, RN, Melanie PLEASE NOTE: All timestamps contained within this report are represented as [place] Standard Time. CONFIDENTIALTY NOTICE: This fax transmission is intended only for the addressee. It contains information that is legally privileged, confidential or otherwise protected from use or disclosure. If you are not the intended recipient, you are strictly prohibited from reviewing, disclosing, copying using or disseminating any of this information or taking any action in reliance on or regarding this information. If you have received this fax in error, please notify us immediately by telephone so that we can arrange for its return to [place]. Phone: [phone], Toll-Free: [phone], Fax: [phone] Page: 2 of 2 Call Id: [id number] Caller Disagree/Comply Comply Caller Understands Yes PreDisposition Call Doctor Care Advice Given Per Guideline SEE PCP WITHIN 3 DAYS: * You need to be seen within 2 or 3 days. HIGH BLOOD PRESSURE: * Untreated high blood pressure may cause damage to your heart, brain, kidneys, and eyes. CALL BACK IF: * Chest pain or difficulty breathing occurs * Your blood pressure is over 180/110 * You become worse. * Difficulty walking, difficulty talking, or severe headache occurs * Weakness or numbness of the face, arm or leg on one side of the body occurs CARE ADVICE given per High Blood Pressure (Adult) guideline. Referrals REFERRED TO PCP OFFICE

## 2020-08-23 NOTE — Telephone Encounter (Signed)
Unable to reach pt by phone will send note to [place] at front desk to schedule.

## 2020-08-27 ENCOUNTER — Other Ambulatory Visit: Payer: Self-pay

## 2020-08-27 ENCOUNTER — Encounter: Payer: Self-pay | Admitting: Family Medicine

## 2020-08-27 ENCOUNTER — Ambulatory Visit (INDEPENDENT_AMBULATORY_CARE_PROVIDER_SITE_OTHER): Payer: 59 | Admitting: Family Medicine

## 2020-08-27 VITALS — BP 140/96 | HR 85 | Temp 98.1°F | Ht 63.0 in | Wt 129.5 lb

## 2020-08-27 DIAGNOSIS — F17200 Nicotine dependence, unspecified, uncomplicated: Secondary | ICD-10-CM

## 2020-08-27 DIAGNOSIS — I10 Essential (primary) hypertension: Secondary | ICD-10-CM | POA: Diagnosis not present

## 2020-08-27 LAB — BASIC METABOLIC PANEL
BUN: 8 mg/dL (ref 6–23)
CO2: 23 mEq/L (ref 19–32)
Calcium: 8.9 mg/dL (ref 8.4–10.5)
Chloride: 104 mEq/L (ref 96–112)
Creatinine, Ser: 0.79 mg/dL (ref 0.40–1.20)
GFR: 82.1 mL/min (ref >60.00)
Glucose, Bld: 80 mg/dL (ref 70–99)
Potassium: 3.8 mEq/L (ref 3.5–5.1)
Sodium: 137 mEq/L (ref 135–145)

## 2020-08-27 LAB — MICROALBUMIN / CREATININE URINE RATIO
Creatinine,U: 161.1 mg/dL
Microalb Creat Ratio: 0.4 mg/g (ref 0.0–30.0)
Microalb, Ur: <0.7 mg/dL (ref 0.0–1.9)

## 2020-08-27 LAB — TSH: TSH: 1.7 u[IU]/mL (ref 0.35–4.50)

## 2020-08-27 MED ORDER — AMLODIPINE BESYLATE 2.5 MG PO TABS: 2.5000 mg | ORAL_TABLET | Freq: Every day | ORAL | 6 refills | Status: AC

## 2020-08-27 NOTE — Progress Notes (Signed)
This visit was conducted in person.  BP (!) 140/96 (BP Location: Right Arm, Cuff Size: Normal)   Pulse 85   Temp 98.1 F (36.7 C) (Temporal)   Ht 5\' 3"  (1.6 m)   Wt 129 lb 8 oz (58.7 kg)   LMP 08/11/2020   SpO2 99%   BMI 22.94 kg/m   BP Readings from Last 3 Encounters:  08/27/20 (!) 140/96  02/12/18 118/74  02/07/18 118/64  BP this morning 136/96   CC: elevated blood pressures  Subjective:    Patient ID: 02/09/18, female    DOB: [birth year], 34 y.o.   MRN: [id number]  HPI: Tawnee Clegg is a 34 y.o. female presenting on 08/27/2020 for Elevated Blood Pressure (C/o elevated BP, 140s/90s.  Also, c/o more frequent HAs.  )   Recent elevated blood pressure readings - 130/90s, up to 141/102.  Noticing increasing frequency of headaches - diffuse achey pressure throughout head.  No vision changes, CP/tightness, SOB, leg swelling.  Has started jogging 3-4 miles a week.   Already limits salt/sodium.  Caffeine - 3-4 cups coffee.  Alcohol - none Smoking - 3/4 ppd. Never tried chantix.  Strong fmhx HTN. No aneurysms in family.   Currently unemployed. Enjoying spending time with daughter.  Currently going through divorce - feels this is the best thing for her.      Relevant past medical, surgical, family and social history reviewed and updated as indicated. Interim medical history since our last visit reviewed. Allergies and medications reviewed and updated. Outpatient Medications Prior to Visit  Medication Sig Dispense Refill  . naproxen (NAPROSYN) 375 MG tablet Take 1 tablet (375 mg total) by mouth 2 (two) times daily with a meal. for 5 days for menstrual migraines (start 3 days before period) 40 tablet 1  . pregabalin (LYRICA) 75 MG capsule Take 75 mg by mouth 2 (two) times daily. Takes 2 capsules in AM and 1 capsule at bedtime    . varenicline (CHANTIX CONTINUING MONTH PAK) 1 MG tablet Take 1 tablet (1 mg total) by mouth 2 (two) times daily. 60 tablet 1  . varenicline (CHANTIX  STARTING MONTH PAK) 0.5 MG X 11 & 1 MG X 42 tablet Take one 0.5 mg tablet by mouth once daily for 3 days, then increase to one 0.5 mg tablet twice daily for 4 days, then increase to one 1 mg tablet twice daily. 53 tablet 0   No facility-administered medications prior to visit.     Per HPI unless specifically indicated in ROS section below Review of Systems Objective:  BP (!) 140/96 (BP Location: Right Arm, Cuff Size: Normal)   Pulse 85   Temp 98.1 F (36.7 C) (Temporal)   Ht 5\' 3"  (1.6 m)   Wt 129 lb 8 oz (58.7 kg)   LMP 08/11/2020   SpO2 99%   BMI 22.94 kg/m   Wt Readings from Last 3 Encounters:  08/27/20 129 lb 8 oz (58.7 kg)  06/25/19 130 lb (59 kg)  02/12/18 132 lb (59.9 kg)      Physical Exam Vitals and nursing note reviewed.  Constitutional:      Appearance: Normal appearance. She is not ill-appearing.  Neck:     Thyroid: No thyroid mass, thyromegaly or thyroid tenderness.  Cardiovascular:     Rate and Rhythm: Normal rate and regular rhythm.     Pulses: Normal pulses.     Heart sounds: Normal heart sounds. No murmur heard.   Pulmonary:  Effort: Pulmonary effort is normal. No respiratory distress.     Breath sounds: Normal breath sounds. No wheezing, rhonchi or rales.  Musculoskeletal:     Right lower leg: No edema.     Left lower leg: No edema.  Neurological:     Mental Status: She is alert.  Psychiatric:        Mood and Affect: Mood normal.        Behavior: Behavior normal.       Results for orders placed or performed in visit on 02/07/18  POC Influenza A&B(BINAX/QUICKVUE)  Result Value Ref Range   Influenza A, POC Negative Negative   Influenza B, POC Negative Negative   EKG - NSR rate 60s, normal axis, shortened PR without delta waves, no acute ST/T changes, largely unchanged from prior.  Assessment & Plan:  This visit occurred during the SARS-CoV-2 public health emergency.  Safety protocols were in place, including screening questions prior to the  visit, additional usage of staff PPE, and extensive cleaning of exam room while observing appropriate contact time as indicated for disinfecting solutions.   Problem List Items Addressed This Visit    Smoker    Continue to encourage full smoking cessation.       Essential hypertension - Primary    BP elevation noted at home and in office today.  Reviewed lifestyle and diet changes to improve blood pressures. DASH diet handout provided today. Check labs and EKG today.  Start amlodipine 2.5mg  daily, monitoring for ankle swelling.  RTC 1-3 mo CPE will recheck BP at that time.       Relevant Medications   amLODipine (NORVASC) 2.5 MG tablet   Other Relevant Orders   TSH   Basic metabolic panel   Microalbumin / creatinine urine ratio   EKG 12-Lead (Completed)       Meds ordered this encounter  Medications  . amLODipine (NORVASC) 2.5 MG tablet    Sig: Take 1 tablet (2.5 mg total) by mouth daily.    Dispense:  30 tablet    Refill:  6   Orders Placed This Encounter  Procedures  . TSH  . Basic metabolic panel  . Microalbumin / creatinine urine ratio  . EKG 12-Lead    Patient instructions: Labs today EKG today  Blood pressures are staying too high - start amlodipine 2.5mg  daily. Continue monitoring blood pressures at home. Let me know if staying too high.  Your goal blood pressure is <140/90, ideally <130/80.  Work on low salt/sodium diet - goal <1.5gm (1,500mg ) per day. Eat a diet high in fruits/vegetables and whole grains.  Look into mediterranean and DASH diet.  Goal activity is 123min/wk of moderate intensity exercise.  This can be split into 30 minute chunks.  If you are not at this level, you can start with smaller 10-15 min increments and slowly build up activity.  Look at www.heart.org for more resources.  Follow up plan: Return in about 3 months (around 11/26/2020), or if symptoms worsen or fail to improve, for annual exam, prior fasting for blood work.  Eustaquio Boyden, MD

## 2020-08-27 NOTE — Patient Instructions (Addendum)
Labs today EKG today  Blood pressures are staying too high - start amlodipine 2.5mg  daily. Continue monitoring blood pressures at home. Let me know if staying too high.  Your goal blood pressure is <140/90, ideally <130/80.  Work on low salt/sodium diet - goal <1.5gm (1,500mg ) per day. Eat a diet high in fruits/vegetables and whole grains.  Look into mediterranean and DASH diet.  Goal activity is 157min/wk of moderate intensity exercise.  This can be split into 30 minute chunks.  If you are not at this level, you can start with smaller 10-15 min increments and slowly build up activity.  Look at www.heart.org for more resources.  DASH Eating Plan DASH stands for "Dietary Approaches to Stop Hypertension." The DASH eating plan is a healthy eating plan that has been shown to reduce high blood pressure (hypertension). It may also reduce your risk for type 2 diabetes, heart disease, and stroke. The DASH eating plan may also help with weight loss. What are tips for following this plan?  General guidelines  Avoid eating more than 2,300 mg (milligrams) of salt (sodium) a day. If you have hypertension, you may need to reduce your sodium intake to 1,500 mg a day.  Limit alcohol intake to no more than 1 drink a day for nonpregnant women and 2 drinks a day for men. One drink equals 12 oz of beer, 5 oz of wine, or 1 oz of hard liquor.  Work with your health care provider to maintain a healthy body weight or to lose weight. Ask what an ideal weight is for you.  Get at least 30 minutes of exercise that causes your heart to beat faster (aerobic exercise) most days of the week. Activities may include walking, swimming, or biking.  Work with your health care provider or diet and nutrition specialist (dietitian) to adjust your eating plan to your individual calorie needs. Reading food labels   Check food labels for the amount of sodium per serving. Choose foods with less than 5 percent of the Daily Value of  sodium. Generally, foods with less than 300 mg of sodium per serving fit into this eating plan.  To find whole grains, look for the word "whole" as the first word in the ingredient list. Shopping  Buy products labeled as "low-sodium" or "no salt added."  Buy fresh foods. Avoid canned foods and premade or frozen meals. Cooking  Avoid adding salt when cooking. Use salt-free seasonings or herbs instead of table salt or sea salt. Check with your health care provider or pharmacist before using salt substitutes.  Do not fry foods. Cook foods using healthy methods such as baking, boiling, grilling, and broiling instead.  Cook with heart-healthy oils, such as olive, canola, soybean, or sunflower oil. Meal planning  Eat a balanced diet that includes: ? 5 or more servings of fruits and vegetables each day. At each meal, try to fill half of your plate with fruits and vegetables. ? Up to 6-8 servings of whole grains each day. ? Less than 6 oz of lean meat, poultry, or fish each day. A 3-oz serving of meat is about the same size as a deck of cards. One egg equals 1 oz. ? 2 servings of low-fat dairy each day. ? A serving of nuts, seeds, or beans 5 times each week. ? Heart-healthy fats. Healthy fats called Omega-3 fatty acids are found in foods such as flaxseeds and coldwater fish, like sardines, salmon, and mackerel.  Limit how much you eat of the  following: ? Canned or prepackaged foods. ? Food that is high in trans fat, such as fried foods. ? Food that is high in saturated fat, such as fatty meat. ? Sweets, desserts, sugary drinks, and other foods with added sugar. ? Full-fat dairy products.  Do not salt foods before eating.  Try to eat at least 2 vegetarian meals each week.  Eat more home-cooked food and less restaurant, buffet, and fast food.  When eating at a restaurant, ask that your food be prepared with less salt or no salt, if possible. What foods are recommended? The items listed  may not be a complete list. Talk with your dietitian about what dietary choices are best for you. Grains Whole-grain or whole-wheat bread. Whole-grain or whole-wheat pasta. Brown rice. Orpah Cobb. Bulgur. Whole-grain and low-sodium cereals. Pita bread. Low-fat, low-sodium crackers. Whole-wheat flour tortillas. Vegetables Fresh or frozen vegetables (raw, steamed, roasted, or grilled). Low-sodium or reduced-sodium tomato and vegetable juice. Low-sodium or reduced-sodium tomato sauce and tomato paste. Low-sodium or reduced-sodium canned vegetables. Fruits All fresh, dried, or frozen fruit. Canned fruit in natural juice (without added sugar). Meat and other protein foods Skinless chicken or [place]. Ground chicken or [place]. Pork with fat trimmed off. Fish and seafood. Egg whites. Dried beans, peas, or lentils. Unsalted nuts, nut butters, and seeds. Unsalted canned beans. Lean cuts of beef with fat trimmed off. Low-sodium, lean deli meat. Dairy Low-fat (1%) or fat-free (skim) milk. Fat-free, low-fat, or reduced-fat cheeses. Nonfat, low-sodium ricotta or cottage cheese. Low-fat or nonfat yogurt. Low-fat, low-sodium cheese. Fats and oils Soft margarine without trans fats. Vegetable oil. Low-fat, reduced-fat, or light mayonnaise and salad dressings (reduced-sodium). Canola, safflower, olive, soybean, and sunflower oils. Avocado. Seasoning and other foods Herbs. Spices. Seasoning mixes without salt. Unsalted popcorn and pretzels. Fat-free sweets. What foods are not recommended? The items listed may not be a complete list. Talk with your dietitian about what dietary choices are best for you. Grains Baked goods made with fat, such as croissants, muffins, or some breads. Dry pasta or rice meal packs. Vegetables Creamed or fried vegetables. Vegetables in a cheese sauce. Regular canned vegetables (not low-sodium or reduced-sodium). Regular canned tomato sauce and paste (not low-sodium or reduced-sodium).  Regular tomato and vegetable juice (not low-sodium or reduced-sodium). Rosita Fire. Olives. Fruits Canned fruit in a light or heavy syrup. Fried fruit. Fruit in cream or butter sauce. Meat and other protein foods Fatty cuts of meat. Ribs. Fried meat. Tomasa Blase. Sausage. Bologna and other processed lunch meats. Salami. Fatback. Hotdogs. Bratwurst. Salted nuts and seeds. Canned beans with added salt. Canned or smoked fish. Whole eggs or egg yolks. Chicken or [place] with skin. Dairy Whole or 2% milk, cream, and half-and-half. Whole or full-fat cream cheese. Whole-fat or sweetened yogurt. Full-fat cheese. Nondairy creamers. Whipped toppings. Processed cheese and cheese spreads. Fats and oils Butter. Stick margarine. Lard. Shortening. Ghee. Bacon fat. Tropical oils, such as coconut, palm kernel, or palm oil. Seasoning and other foods Salted popcorn and pretzels. Onion salt, garlic salt, seasoned salt, table salt, and sea salt. Worcestershire sauce. Tartar sauce. Barbecue sauce. Teriyaki sauce. Soy sauce, including reduced-sodium. Steak sauce. Canned and packaged gravies. Fish sauce. Oyster sauce. Cocktail sauce. Horseradish that you find on the shelf. Ketchup. Mustard. Meat flavorings and tenderizers. Bouillon cubes. Hot sauce and Tabasco sauce. Premade or packaged marinades. Premade or packaged taco seasonings. Relishes. Regular salad dressings. Where to find more information:  National Heart, Lung, and Blood Institute: PopSteam.is  American Heart Association: www.heart.org  Summary  The DASH eating plan is a healthy eating plan that has been shown to reduce high blood pressure (hypertension). It may also reduce your risk for type 2 diabetes, heart disease, and stroke.  With the DASH eating plan, you should limit salt (sodium) intake to 2,300 mg a day. If you have hypertension, you may need to reduce your sodium intake to 1,500 mg a day.  When on the DASH eating plan, aim to eat more fresh fruits and  vegetables, whole grains, lean proteins, low-fat dairy, and heart-healthy fats.  Work with your health care provider or diet and nutrition specialist (dietitian) to adjust your eating plan to your individual calorie needs. This information is not intended to replace advice given to you by your health care provider. Make sure you discuss any questions you have with your health care provider. Document Revised: 11/23/2017 Document Reviewed: 12/04/2016 Elsevier Patient Education  2020 [employer].

## 2020-08-27 NOTE — Assessment & Plan Note (Signed)
BP elevation noted at home and in office today.  Reviewed lifestyle and diet changes to improve blood pressures. DASH diet handout provided today. Check labs and EKG today.  Start amlodipine 2.5mg  daily, monitoring for ankle swelling.  RTC 1-3 mo CPE will recheck BP at that time.

## 2020-08-27 NOTE — Assessment & Plan Note (Signed)
Continue to encourage full smoking cessation. 

## 2020-09-29 ENCOUNTER — Telehealth (INDEPENDENT_AMBULATORY_CARE_PROVIDER_SITE_OTHER): Payer: 59 | Admitting: Family Medicine

## 2020-09-29 ENCOUNTER — Encounter: Payer: Self-pay | Admitting: Family Medicine

## 2020-09-29 ENCOUNTER — Other Ambulatory Visit: Payer: Self-pay

## 2020-09-29 VITALS — BP 131/89 | HR 84 | Temp 97.7°F | Ht 63.0 in | Wt 127.0 lb

## 2020-09-29 DIAGNOSIS — J22 Unspecified acute lower respiratory infection: Secondary | ICD-10-CM

## 2020-09-29 DIAGNOSIS — F17200 Nicotine dependence, unspecified, uncomplicated: Secondary | ICD-10-CM

## 2020-09-29 MED ORDER — AZITHROMYCIN 250 MG PO TABS: ORAL_TABLET | ORAL | 0 refills | Status: AC

## 2020-09-29 MED ORDER — HYDROCOD POLST-CPM POLST ER 10-8 MG/5ML PO SUER: 5.0000 mL | Freq: Every evening | ORAL | 0 refills | Status: AC | PRN

## 2020-09-29 NOTE — Assessment & Plan Note (Signed)
Continue to encourage cessation. 

## 2020-09-29 NOTE — Assessment & Plan Note (Addendum)
Anticipate acute bronchitis. In smoker with predisposition to PNA, will treat with zpack. Reviewed supportive care measures to continue treatment at home. Add tussionex PRN cough Fortunately had 2 negative rapid home COVID tests.

## 2020-09-29 NOTE — Progress Notes (Signed)
Virtual visit completed through MyChart, a video enabled telemedicine application. Due to national recommendations of social distancing due to COVID-19, a virtual visit is felt to be most appropriate for this patient at this time. Reviewed limitations, risks, security and privacy concerns of performing a virtual visit and the availability of in person appointments. I also reviewed that there may be a patient responsible charge related to this service. The patient agreed to proceed.   Patient location: home Provider location: [place] at [facility], office Persons participating in this virtual visit: patient, provider   If any vitals were documented, they were collected by patient at home unless specified below.    BP 131/89   Pulse 84   Temp 97.7 F (36.5 C)   Ht 5\' 3"  (1.6 m)   Wt 127 lb (57.6 kg)   LMP 09/25/2020   BMI 22.50 kg/m    CC: URI symptoms Subjective:    Patient ID: [id number], female    DOB: Sep 09, 1986, 34 y.o.   MRN: [id number]  HPI: Jacqueline [name] is a 34 y.o. female presenting on 09/29/2020 for Sore Throat (C/o sore throat, nasal congestion, runny nose, drainage and cough.  Had a low-grade fever- 99.8. Sxs started about 1 wk ago.  Had rapid COVID test done 09/27/20.  Results, neg.  Tried Mucinex and other OTC cough meds.  H/o bronchitis and pneumonia. )   1+ wk h/o coryza, rhinorrhea, ST, progressing symptoms. PNdrainage. Tmax 99.8. head > chest congestion. Harsh deep cough she feels in chest productive of clear mucous. Trouble sleeping at night due to cough. Managing with mucinex, OTC cough syrup.   No fevers/chills, ear or tooth pain, HA, loss of taste/smell, dyspnea, abd pain, nausea, diarrhea. No body aches.   COVID test negative 09/27/2020 x2 (rapid home test).  No sick contacts at home. No known exposure to COVID.  Continued 1/2 pack smoker.  H/o bronchitis and pneumonia in the past.  Currently on cycle.  No h/o asthma or COPD.       Relevant past  medical, surgical, family and social history reviewed and updated as indicated. Interim medical history since our last visit reviewed. Allergies and medications reviewed and updated. Outpatient Medications Prior to Visit  Medication Sig Dispense Refill  . amLODipine (NORVASC) 2.5 MG tablet Take 1 tablet (2.5 mg total) by mouth daily. 30 tablet 6  . naproxen (NAPROSYN) 375 MG tablet Take 1 tablet (375 mg total) by mouth 2 (two) times daily with a meal. for 5 days for menstrual migraines (start 3 days before period) 40 tablet 1   No facility-administered medications prior to visit.     Per HPI unless specifically indicated in ROS section below Review of Systems Objective:  BP 131/89   Pulse 84   Temp 97.7 F (36.5 C)   Ht 5\' 3"  (1.6 m)   Wt 127 lb (57.6 kg)   LMP 09/25/2020   BMI 22.50 kg/m   Wt Readings from Last 3 Encounters:  09/29/20 127 lb (57.6 kg)  08/27/20 129 lb 8 oz (58.7 kg)  06/25/19 130 lb (59 kg)       Physical exam: Gen: alert, NAD, not ill appearing Pulm: speaks in complete sentences without increased work of breathing Psych: normal mood, normal thought content      Assessment & Plan:   Problem List Items Addressed This Visit    Smoker    Continue to encourage cessation.       Acute respiratory infection - Primary  Anticipate acute bronchitis. In smoker with predisposition to PNA, will treat with zpack. Reviewed supportive care measures to continue treatment at home. Add tussionex PRN cough Fortunately had 2 negative rapid home COVID tests.       Relevant Medications   azithromycin (ZITHROMAX) 250 MG tablet   chlorpheniramine-HYDROcodone (TUSSIONEX PENNKINETIC ER) 10-8 MG/5ML SUER       Meds ordered this encounter  Medications  . azithromycin (ZITHROMAX) 250 MG tablet    Sig: Take two tablets on day one followed by one tablet on days 2-5    Dispense:  6 each    Refill:  0  . chlorpheniramine-HYDROcodone (TUSSIONEX PENNKINETIC ER) 10-8 MG/5ML  SUER    Sig: Take 5 mLs by mouth at bedtime as needed for cough.    Dispense:  70 mL    Refill:  0   No orders of the defined types were placed in this encounter.   I discussed the assessment and treatment plan with the patient. The patient was provided an opportunity to ask questions and all were answered. The patient agreed with the plan and demonstrated an understanding of the instructions. The patient was advised to call back or seek an in-person evaluation if the symptoms worsen or if the condition fails to improve as anticipated.  Follow up plan: Return if symptoms worsen or fail to improve.  Eustaquio Boyden, MD
